# Patient Record
Sex: Female | Born: 1945 | Race: White | Hispanic: No | Marital: Married | Smoking: Never smoker
Health system: Southern US, Community
[De-identification: ages and names within clinical notes are randomized; demographics above are authoritative.]

## PROBLEM LIST (undated history)

## (undated) DIAGNOSIS — J301 Allergic rhinitis due to pollen: Secondary | ICD-10-CM

## (undated) DIAGNOSIS — E7800 Pure hypercholesterolemia, unspecified: Secondary | ICD-10-CM

## (undated) DIAGNOSIS — Z9889 Other specified postprocedural states: Secondary | ICD-10-CM

## (undated) DIAGNOSIS — E2839 Other primary ovarian failure: Secondary | ICD-10-CM

## (undated) DIAGNOSIS — K3580 Unspecified acute appendicitis: Secondary | ICD-10-CM

## (undated) DIAGNOSIS — E559 Vitamin D deficiency, unspecified: Secondary | ICD-10-CM

## (undated) DIAGNOSIS — R112 Nausea with vomiting, unspecified: Secondary | ICD-10-CM

## (undated) DIAGNOSIS — I10 Essential (primary) hypertension: Secondary | ICD-10-CM

## (undated) DIAGNOSIS — K219 Gastro-esophageal reflux disease without esophagitis: Secondary | ICD-10-CM

## (undated) DIAGNOSIS — K200 Eosinophilic esophagitis: Secondary | ICD-10-CM

## (undated) HISTORY — DX: Unspecified acute appendicitis: K35.80

## (undated) HISTORY — DX: Other primary ovarian failure: E28.39

## (undated) HISTORY — DX: Pure hypercholesterolemia, unspecified: E78.00

## (undated) HISTORY — PX: ABDOMINAL HYSTERECTOMY: SHX81

## (undated) HISTORY — DX: Eosinophilic esophagitis: K20.0

## (undated) HISTORY — DX: Allergic rhinitis due to pollen: J30.1

## (undated) HISTORY — DX: Vitamin D deficiency, unspecified: E55.9

## (undated) HISTORY — DX: Gastro-esophageal reflux disease without esophagitis: K21.9

## (undated) HISTORY — DX: Essential (primary) hypertension: I10

---

## 1997-04-15 DIAGNOSIS — C4491 Basal cell carcinoma of skin, unspecified: Secondary | ICD-10-CM

## 1997-04-15 HISTORY — DX: Basal cell carcinoma of skin, unspecified: C44.91

## 1998-02-16 DIAGNOSIS — D229 Melanocytic nevi, unspecified: Secondary | ICD-10-CM

## 1998-02-16 HISTORY — DX: Melanocytic nevi, unspecified: D22.9

## 1998-07-03 ENCOUNTER — Other Ambulatory Visit: Admission: RE | Admit: 1998-07-03 | Discharge: 1998-07-03 | Payer: Self-pay | Admitting: Obstetrics and Gynecology

## 1998-08-29 HISTORY — PX: DILATION AND CURETTAGE OF UTERUS: SHX78

## 1998-09-22 ENCOUNTER — Ambulatory Visit (HOSPITAL_COMMUNITY): Admission: RE | Admit: 1998-09-22 | Discharge: 1998-09-22 | Payer: Self-pay | Admitting: Obstetrics and Gynecology

## 1998-09-22 ENCOUNTER — Encounter (INDEPENDENT_AMBULATORY_CARE_PROVIDER_SITE_OTHER): Payer: Self-pay | Admitting: Specialist

## 2000-09-24 ENCOUNTER — Other Ambulatory Visit: Admission: RE | Admit: 2000-09-24 | Discharge: 2000-09-24 | Payer: Self-pay | Admitting: Obstetrics and Gynecology

## 2000-10-30 ENCOUNTER — Ambulatory Visit (HOSPITAL_COMMUNITY): Admission: RE | Admit: 2000-10-30 | Discharge: 2000-10-30 | Payer: Self-pay | Admitting: *Deleted

## 2000-10-30 ENCOUNTER — Encounter: Payer: Self-pay | Admitting: *Deleted

## 2001-02-10 ENCOUNTER — Inpatient Hospital Stay (HOSPITAL_COMMUNITY): Admission: RE | Admit: 2001-02-10 | Discharge: 2001-02-13 | Payer: Self-pay | Admitting: Obstetrics and Gynecology

## 2001-02-10 ENCOUNTER — Encounter (INDEPENDENT_AMBULATORY_CARE_PROVIDER_SITE_OTHER): Payer: Self-pay | Admitting: Specialist

## 2002-04-09 ENCOUNTER — Other Ambulatory Visit: Admission: RE | Admit: 2002-04-09 | Discharge: 2002-04-09 | Payer: Self-pay | Admitting: Obstetrics and Gynecology

## 2003-10-18 ENCOUNTER — Other Ambulatory Visit: Admission: RE | Admit: 2003-10-18 | Discharge: 2003-10-18 | Payer: Self-pay | Admitting: Obstetrics and Gynecology

## 2008-02-23 ENCOUNTER — Encounter: Payer: Self-pay | Admitting: Pulmonary Disease

## 2008-03-02 ENCOUNTER — Ambulatory Visit: Payer: Self-pay | Admitting: Pulmonary Disease

## 2008-03-02 DIAGNOSIS — E785 Hyperlipidemia, unspecified: Secondary | ICD-10-CM | POA: Insufficient documentation

## 2008-03-02 DIAGNOSIS — D259 Leiomyoma of uterus, unspecified: Secondary | ICD-10-CM | POA: Insufficient documentation

## 2008-03-02 DIAGNOSIS — R05 Cough: Secondary | ICD-10-CM

## 2008-03-02 DIAGNOSIS — J309 Allergic rhinitis, unspecified: Secondary | ICD-10-CM | POA: Insufficient documentation

## 2008-03-02 DIAGNOSIS — D721 Eosinophilia: Secondary | ICD-10-CM

## 2008-03-02 DIAGNOSIS — K219 Gastro-esophageal reflux disease without esophagitis: Secondary | ICD-10-CM | POA: Insufficient documentation

## 2010-02-25 LAB — CONVERTED CEMR LAB
Basophils Absolute: 0.1 10*3/uL (ref 0.0–0.1)
Basophils Relative: 1.5 % (ref 0.0–3.0)
Eosinophils Absolute: 0.7 10*3/uL (ref 0.0–0.7)
Eosinophils Relative: 12.7 % — ABNORMAL HIGH (ref 0.0–5.0)
HCT: 36.3 % (ref 36.0–46.0)
Hemoglobin: 12.5 g/dL (ref 12.0–15.0)
Lymphocytes Relative: 23.4 % (ref 12.0–46.0)
MCHC: 34.4 g/dL (ref 30.0–36.0)
MCV: 84.1 fL (ref 78.0–100.0)
Monocytes Absolute: 0.4 10*3/uL (ref 0.1–1.0)
Monocytes Relative: 7.2 % (ref 3.0–12.0)
Neutro Abs: 3 10*3/uL (ref 1.4–7.7)
Neutrophils Relative %: 55.2 % (ref 43.0–77.0)
Platelets: 215 10*3/uL (ref 150–400)
RBC: 4.31 M/uL (ref 3.87–5.11)
RDW: 12.6 % (ref 11.5–14.6)
WBC: 5.5 10*3/uL (ref 4.5–10.5)

## 2010-03-29 HISTORY — PX: OTHER SURGICAL HISTORY: SHX169

## 2010-04-02 ENCOUNTER — Ambulatory Visit (HOSPITAL_COMMUNITY)
Admission: RE | Admit: 2010-04-02 | Discharge: 2010-04-02 | Disposition: A | Discharge status: Home / Self Care | Payer: Federal, State, Local not specified - PPO | Source: Ambulatory Visit | Attending: Gastroenterology | Admitting: Gastroenterology

## 2010-04-02 DIAGNOSIS — K449 Diaphragmatic hernia without obstruction or gangrene: Secondary | ICD-10-CM | POA: Insufficient documentation

## 2010-04-02 DIAGNOSIS — R1310 Dysphagia, unspecified: Secondary | ICD-10-CM | POA: Insufficient documentation

## 2010-04-02 DIAGNOSIS — K209 Esophagitis, unspecified without bleeding: Secondary | ICD-10-CM | POA: Insufficient documentation

## 2010-04-02 DIAGNOSIS — K222 Esophageal obstruction: Secondary | ICD-10-CM | POA: Insufficient documentation

## 2010-04-18 ENCOUNTER — Ambulatory Visit (HOSPITAL_COMMUNITY)
Admission: RE | Admit: 2010-04-18 | Discharge: 2010-04-18 | Disposition: A | Discharge status: Home / Self Care | Payer: Federal, State, Local not specified - PPO | Source: Ambulatory Visit | Attending: Gastroenterology | Admitting: Gastroenterology

## 2010-04-18 ENCOUNTER — Other Ambulatory Visit: Payer: Self-pay | Admitting: Gastroenterology

## 2010-04-18 DIAGNOSIS — K200 Eosinophilic esophagitis: Secondary | ICD-10-CM | POA: Insufficient documentation

## 2010-04-18 DIAGNOSIS — K449 Diaphragmatic hernia without obstruction or gangrene: Secondary | ICD-10-CM | POA: Insufficient documentation

## 2010-04-18 DIAGNOSIS — R1310 Dysphagia, unspecified: Secondary | ICD-10-CM | POA: Insufficient documentation

## 2010-04-18 DIAGNOSIS — E785 Hyperlipidemia, unspecified: Secondary | ICD-10-CM | POA: Insufficient documentation

## 2010-04-18 DIAGNOSIS — K222 Esophageal obstruction: Secondary | ICD-10-CM | POA: Insufficient documentation

## 2010-04-18 DIAGNOSIS — K219 Gastro-esophageal reflux disease without esophagitis: Secondary | ICD-10-CM | POA: Insufficient documentation

## 2010-06-15 NOTE — Op Note (Signed)
[facility]  Patient:    Sharon Cook, Sharon Cook. Visit Number: [id number] MRN: [id number]          Service Type: Attending:  Festus Holts, M.D. Dictated by:   Festus Holts, M.D.   CC:         Guy Sandifer. Arleta Creek, M.D.   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 Harold Hedge.  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 Harold Hedge.  OPERATION:                    Following Dr. Marvetta Gibbons transabdominal hysterectomy, a conservative lower abdominal dermatolipectomy with a conservative and deep plane liposuction is performed.  SURGEON:                      Festus Holts, M.D.  ANESTHESIA:                   General endotracheal anesthesia plus infiltration of tumescent solution of 500 cc of Ringers lactate out of a 1000 cc bag of Ringers lactate into which 1 cc of 1:1000 epinephrine had been injected.  DESCRIPTION OF PROCEDURE:     Following Dr. Marvetta Gibbons transabdominal hysterectomy, the tumescent solution noted above is injected in the deep plane of the mid upper and lateral abdomen.  Next, using cannulas of 3 and 4 mm, the deep plane of the central abdomen, upper abdomen and very conservatively over the lateral abdomen is performed.  Approximately 900 cc of aspirate is obtained, half of which is liquid tumescent solution.  Next, the transverse suprapubic incision made by Dr. Henderson Cloud is extended laterally to a point proximally 4 cm inferior to the anterior iliac spine. Next, an abdominal flap is elevated from suprapubic area up to the umbilicus and no further.  The flap is elevated along the plane of the external oblique fascias and external fascia of the  anterior rectus sheath.  Next, the anterior rectus sheath is plicated vertically with a running locking 0 Ticron suture. Next, the patient is placed in the semi-Fowlers position and the abdominal flap pulled inferiorly and trimmed and tailored and cut to fit to the transverse lower abdominal incision.  Hemostasis is obtained throughout the procedure by clamps and electrocautery.  The wound cavity is irrigated with copious amount of saline and hemostasis rechecked and found to be adequate. Next, the Scarpa/Campers fascia is closed with interrupted sutures of 3-0 Vicryl and the skin closed with interrupted and running sutures of 3-0 Monocryl subcutaneously.  A 7 mm Jackson-Pratt drain is placed on each side and brought out the mid lateral aspect of the suprapubic area and fixed with 3-0 nylon.  The abdomen is dressed in a light compression dressing.  The patient tolerated the procedure well.  Estimated blood loss 50-75 cc plus that contained within the aspirate which was minimal. Dictated by:   Festus Holts, M.D. Attending:  Festus Holts, M.D. DD:  02/10/01 TD:  02/10/01 Job: [id number]

## 2010-06-15 NOTE — H&P (Signed)
[facility]  Patient:    Sharon Cook, Sharon Cook Visit Number: [id number] MRN: [id number]          Service Type: Attending:  Guy Sandifer. Arleta Creek, M.D. Dictated by:   Guy Sandifer Arleta Creek, M.D. Adm. Date:  02/10/01                           History and Physical  CHIEF COMPLAINT:              Uterine leiomyomata.  HISTORY OF PRESENT ILLNESS:   This patient is a 65 year old married white female, G5, P3, status post tubal ligation with enlarging uterine leiomyomata. The uterus is now approximately 12 plus weeks in size and irregular in shape. This has also been accompanied by right lower quadrant pain on and off. After a careful discussion of the options, the patient is being admitted for a total abdominal hysterectomy and bilateral salpingo-oophorectomy. Dr. Dub Amis will follow with a abdominoplasty and liposuction.  PAST MEDICAL HISTORY:         Negative.  PAST SURGICAL HISTORY:        1. Bilateral tubal ligation.                               2. Hysteroscopy D&C in 2000.  OBSTETRIC HISTORY:            1. Vaginal delivery.                               2. Cesarean section x 2.                               3. Spontaneous abortion x 2.  MEDICATIONS:                  Multivitamins.  ALLERGIES:                    No known drug allergies.  SOCIAL HISTORY:               The patient denies tobacco, alcohol or drug abuse.  FAMILY HISTORY:               Positive for hypertension in mother, heart disease in father and sister, hypothyroidism in mother.  REVIEW OF SYSTEMS:            Negative except as above.  PHYSICAL EXAMINATION:  VITAL SIGNS:                  Height 5 feet 2 inches, weight 159 1/2 pounds. Blood pressure 130/80.  HEENT:                        Without thyromegaly.  LUNGS:                        Clear to auscultation.  HEART:                        Regular rate and rhythm.  BACK:                         Without CVA  tenderness.  BREASTS:  Without mass, traction or discharge.  ABDOMEN:                      Soft, nontender without masses.  PELVIC:                       Vulva, vagina, and cervix without lesions. Uterus is regular in contour with at least one fibroid on the uterine fundus measuring at least 4 cm with the uterus measuring 10-12 cm overall in size. Adnexal exam nontender without masses.  RECTAL:                       Without masses.  EXTREMITIES:                  Grossly within normal limits.  NEUROLOGIC:                   Grossly within normal limits.  ASSESSMENT:                   Enlarging uterine leiomyomata.  PLAN:                         Total abdominal hysterectomy and bilateral salpingo-oophorectomy. Dictated by:   Guy Sandifer Arleta Creek, M.D. Attending:  Guy Sandifer Arleta Creek, M.D. DD:  02/09/01 TD:  02/09/01 Job: [id number]

## 2010-06-15 NOTE — Op Note (Signed)
[facility]  Patient:    Sharon Cook, Sharon Cook Visit Number: [id number] MRN: [id number]          Service Type: GYN Location: [id number] Attending Physician:  Soledad Gerlach Dictated by:   Guy Sandifer Arleta Creek, M.D. Proc. Date: 02/10/01 Admit Date:  02/10/2001                             Operative Report  PREOPERATIVE DIAGNOSIS:       Uterine leiomyomata.  POSTOPERATIVE DIAGNOSIS:      Uterine leiomyomata.  OPERATION:                    Total abdominal hysterectomy with bilateral                               salpingo-oophorectomy.  SURGEON:                      Guy Sandifer. Arleta Creek, M.D.  ASSISTANT:                    Duke Salvia. Marcelle Overlie, M.D.  ANESTHESIA:                   General with endotracheal intubation - Gretta Cool., M.D.  ESTIMATED BLOOD LOSS:         100 cc.  INDICATIONS AND CONSENT:      The patient is a 65 year old married white female, G5, P3, status post tubal ligation with enlarging uterine leiomyomata. Details are dictated in History and Physical.  Total abdominal hysterectomy with bilateral salpingo-oophorectomy is discussed with the patient.  The patient will have abdominoplasty and liposuction with Dr. Dub Amis to follow the hysterectomy.  Potential risks and complications have been discussed including, but not limited to infection, bowel, bladder or ureteral damage, bleeding requiring transfusion of blood products with possible transfusion reaction, HIV and hepatitis acquisition, DVT, PE, and pneumonia, fistula formation, postoperative dyspareunia.  All questions have been answered and consent is signed on the chart.  FINDINGS:                     The uterus is approximately 10 weeks in size. The proximal segment of the fallopian tube is adherent to the anterior uterine fundus from the left side.  The right proximal tube is also adherent with multiple filmy adhesions.   Ovaries are normal bilaterally.  Anterior cul-de-sac was also scarred down with multiple adhesions of the bladder to the lower anterior uterine fundus.  DESCRIPTION OF PROCEDURE:     The patient was taken to the operating room where she was placed in the dorsolithotomy position.  She had general anesthesia via endotracheal intubation.  She then had an extensive abdominal prep per Dr. Dub Amis as well as a vaginal prep.  A Foley catheter was placed. She was draped in a sterile fashion.  Then, entering through a Pfannenstiel incision, dissection was carried out in layers to the peritoneum.  The peritoneum was incised and extended superiorly and inferiorly.  A OSullivan-OConnor retractor was placed and towels were placed under the retractor bilaterally to help prevent any undue compression of  the pelvis with the retractor.  The bladder blade was placed.  The bowel was packed away and the upper blade was placed.  The adhesions on the anterior fundus are taken down sharply and bluntly.  Kelly clamps were placed in the proximal ligaments bilaterally.  The left round ligament is identified and ligated with 0 Monocryl suture.  It was then taken down sharply.  The anterior mesosalpinx was taken down.  The posterior leaf of the broad ligament is bluntly perforated and the LigaSure instrument was used to take down the infundibulopelvic ligament.  This was after careful identification of the ureter, and revealed it to be well-away from the area of surgery.  A similar procedure was then carried out on the right side.  After carefully advancing the bladder adequately, the right uterine vessels are skeletonized, and then taken down with the LigaSure.  A similar procedure was carried out on the left side.  Then, using the straight Heaney clamps, progressive bites were used to take down the cardinal ligaments bilaterally.  Curved clamps were then used to take down the uterosacral ligaments bilaterally,  and the vaginal fornix was entered bilaterally.  The specimen was then cut free with Satinsky scissors.  Careful inspection reveals the cervix to have been totally removed. The vaginal cuff was then closed with figure-of-eight sutures.  Uterosacral ligaments were plicated in the midline with a single suture.  Hemostasis is obtained with unipolar cautery.  The distal portion of the right fallopian tube was adherent to the right pelvic side wall, and this then sharply resected.  The appendix is normal.  Again, copious irrigation is carried out and all returns is clear.  Packs are removed.  Retractors are removed and the anterior peritoneum is closed in a running fashion with 2-0 Monocryl suture which is also used to reapproximate the pyramidalis muscle in the midline.  The anterior rectus fascia was then closed in a running fashion with 0 PDS suture.  At this point, Dr. Dub Amis enters the room, and performs his procedure under separate dictation. Dictated by:   Guy Sandifer Arleta Creek, M.D. Attending Physician:  Soledad Gerlach DD:  02/10/01 TD:  02/10/01 Job: [id number]

## 2010-06-15 NOTE — Discharge Summary (Signed)
[facility]  Patient:    Sharon Cook, Sharon Cook Visit Number: [id number] MRN: [id number]          Service Type: GYN Location: [phone] [id number] Attending Physician:  Soledad Gerlach Dictated by:   Guy Sandifer Arleta Creek, M.D. Admit Date:  02/10/2001 Discharge Date: 02/13/2001   CC:         Festus Holts, M.D.   Discharge Summary  ADMISSION DIAGNOSES:          1. Uterine leiomyomata.                               2. Abdominal lipodystrophy.  DISCHARGE DIAGNOSES:          1. Uterine leiomyomata.                               2. Abdominal lipodystrophy.  PROCEDURE February 10, 2001:   1. Total abdominal hysterectomy with bilateral                                  salpingo-oophorectomy.                               2. Lower abdominal dermatolipectomy per                                  Dr. Dub Amis.  REASON FOR ADMISSION:         This patient is a 65 year old married, white female G5, P3 status post tubal ligation with enlarging uterine leiomyomata. Details are dictated in history and physical.  She is being admitted for the above procedures.  HOSPITAL COURSE:              The patient is taken to the operating room and undergoes the above procedures without complication.  Estimated blood loss is a total of approximately 175 cc.  On the evening of surgery, she has adequate pain control and is resting.  She remains afebrile with stable vital signs. On the first postoperative day, hemoglobin is 9.0 and white count is 6.5.  She has some nausea and is unable to tolerate broth.  No flatus.  She remains stable with afebrile temperature.  Her urine output is good and her drains are okay per Dr. Dub Amis exam.  On the second postoperative day, still has not passed flatus, has some mild nausea, but is ambulating.  Maximum temperature is 100.7, although she is 98.8 at the time of examination.  Lungs are clear. Extremities are nontender, without cords.   Abdominal exam is okay per Dr. Dub Amis examination.  On the day of discharge, she is feeling better, having small bowel movements and ambulating without trouble.  She remains afebrile.  CONDITION ON DISCHARGE:       Good.  DIET:                         Regular as tolerated.  ACTIVITY:                     1. No lifting.  2. No operation of automobiles.                               3. No vaginal entry.  MEDICATIONS:                  1. Percocet 5/325 #30, 1-2 p.o. q.4h. p.r.n.                               2. Multivitamin daily.                               3. Iron supplement daily.  DISCHARGE INSTRUCTIONS:       She is to call the office for problems, including, but not limited to, temperature of greater than 100.5, increasing pain, persistent nausea, vomiting or heavy bleeding.  FOLLOW-UP:                    Follow up is in my office in two weeks and in Dr. Dub Amis office as instructed. Dictated by:   Guy Sandifer Arleta Creek, M.D. Attending Physician:  Soledad Gerlach DD:  02/13/01 TD:  02/15/01 Job: [id number]

## 2011-01-29 DIAGNOSIS — I10 Essential (primary) hypertension: Secondary | ICD-10-CM

## 2011-01-29 HISTORY — DX: Essential (primary) hypertension: I10

## 2012-03-30 DIAGNOSIS — E7800 Pure hypercholesterolemia, unspecified: Secondary | ICD-10-CM | POA: Diagnosis not present

## 2012-03-30 DIAGNOSIS — I10 Essential (primary) hypertension: Secondary | ICD-10-CM | POA: Diagnosis not present

## 2012-05-01 DIAGNOSIS — J069 Acute upper respiratory infection, unspecified: Secondary | ICD-10-CM | POA: Diagnosis not present

## 2012-05-01 DIAGNOSIS — Z1331 Encounter for screening for depression: Secondary | ICD-10-CM | POA: Diagnosis not present

## 2012-05-26 DIAGNOSIS — H04129 Dry eye syndrome of unspecified lacrimal gland: Secondary | ICD-10-CM | POA: Diagnosis not present

## 2012-06-09 DIAGNOSIS — Z1231 Encounter for screening mammogram for malignant neoplasm of breast: Secondary | ICD-10-CM | POA: Diagnosis not present

## 2012-06-09 DIAGNOSIS — R928 Other abnormal and inconclusive findings on diagnostic imaging of breast: Secondary | ICD-10-CM | POA: Diagnosis not present

## 2012-06-16 DIAGNOSIS — Z1231 Encounter for screening mammogram for malignant neoplasm of breast: Secondary | ICD-10-CM | POA: Diagnosis not present

## 2012-06-16 DIAGNOSIS — R928 Other abnormal and inconclusive findings on diagnostic imaging of breast: Secondary | ICD-10-CM | POA: Diagnosis not present

## 2012-08-24 DIAGNOSIS — I10 Essential (primary) hypertension: Secondary | ICD-10-CM | POA: Diagnosis not present

## 2013-01-12 DIAGNOSIS — L821 Other seborrheic keratosis: Secondary | ICD-10-CM | POA: Diagnosis not present

## 2013-01-12 DIAGNOSIS — D239 Other benign neoplasm of skin, unspecified: Secondary | ICD-10-CM | POA: Diagnosis not present

## 2013-01-12 DIAGNOSIS — L570 Actinic keratosis: Secondary | ICD-10-CM | POA: Diagnosis not present

## 2013-02-25 DIAGNOSIS — J3489 Other specified disorders of nose and nasal sinuses: Secondary | ICD-10-CM | POA: Diagnosis not present

## 2013-06-15 DIAGNOSIS — H269 Unspecified cataract: Secondary | ICD-10-CM | POA: Diagnosis not present

## 2013-06-16 DIAGNOSIS — Z1231 Encounter for screening mammogram for malignant neoplasm of breast: Secondary | ICD-10-CM | POA: Diagnosis not present

## 2013-06-23 DIAGNOSIS — R928 Other abnormal and inconclusive findings on diagnostic imaging of breast: Secondary | ICD-10-CM | POA: Diagnosis not present

## 2013-06-23 DIAGNOSIS — R922 Inconclusive mammogram: Secondary | ICD-10-CM | POA: Diagnosis not present

## 2013-06-23 DIAGNOSIS — N6459 Other signs and symptoms in breast: Secondary | ICD-10-CM | POA: Diagnosis not present

## 2013-07-08 DIAGNOSIS — I10 Essential (primary) hypertension: Secondary | ICD-10-CM | POA: Diagnosis not present

## 2013-07-29 DIAGNOSIS — I10 Essential (primary) hypertension: Secondary | ICD-10-CM | POA: Diagnosis not present

## 2013-10-25 DIAGNOSIS — H6120 Impacted cerumen, unspecified ear: Secondary | ICD-10-CM | POA: Diagnosis not present

## 2013-10-25 DIAGNOSIS — H6980 Other specified disorders of Eustachian tube, unspecified ear: Secondary | ICD-10-CM | POA: Diagnosis not present

## 2013-12-20 DIAGNOSIS — L659 Nonscarring hair loss, unspecified: Secondary | ICD-10-CM | POA: Diagnosis not present

## 2013-12-20 DIAGNOSIS — Z23 Encounter for immunization: Secondary | ICD-10-CM | POA: Diagnosis not present

## 2013-12-20 DIAGNOSIS — K219 Gastro-esophageal reflux disease without esophagitis: Secondary | ICD-10-CM | POA: Diagnosis not present

## 2013-12-20 DIAGNOSIS — K200 Eosinophilic esophagitis: Secondary | ICD-10-CM | POA: Diagnosis not present

## 2013-12-20 DIAGNOSIS — E780 Pure hypercholesterolemia: Secondary | ICD-10-CM | POA: Diagnosis not present

## 2013-12-20 DIAGNOSIS — N951 Menopausal and female climacteric states: Secondary | ICD-10-CM | POA: Diagnosis not present

## 2013-12-20 DIAGNOSIS — I10 Essential (primary) hypertension: Secondary | ICD-10-CM | POA: Diagnosis not present

## 2013-12-20 DIAGNOSIS — J301 Allergic rhinitis due to pollen: Secondary | ICD-10-CM | POA: Diagnosis not present

## 2013-12-20 DIAGNOSIS — Z0000 Encounter for general adult medical examination without abnormal findings: Secondary | ICD-10-CM | POA: Diagnosis not present

## 2014-01-04 DIAGNOSIS — Z09 Encounter for follow-up examination after completed treatment for conditions other than malignant neoplasm: Secondary | ICD-10-CM | POA: Diagnosis not present

## 2014-01-04 DIAGNOSIS — R928 Other abnormal and inconclusive findings on diagnostic imaging of breast: Secondary | ICD-10-CM | POA: Diagnosis not present

## 2014-01-04 DIAGNOSIS — Z780 Asymptomatic menopausal state: Secondary | ICD-10-CM | POA: Diagnosis not present

## 2014-01-04 DIAGNOSIS — N6002 Solitary cyst of left breast: Secondary | ICD-10-CM | POA: Diagnosis not present

## 2014-05-04 DIAGNOSIS — E780 Pure hypercholesterolemia: Secondary | ICD-10-CM | POA: Diagnosis not present

## 2014-05-04 DIAGNOSIS — Z79899 Other long term (current) drug therapy: Secondary | ICD-10-CM | POA: Diagnosis not present

## 2014-06-01 DIAGNOSIS — R05 Cough: Secondary | ICD-10-CM | POA: Diagnosis not present

## 2014-06-14 DIAGNOSIS — H524 Presbyopia: Secondary | ICD-10-CM | POA: Diagnosis not present

## 2014-06-14 DIAGNOSIS — H04123 Dry eye syndrome of bilateral lacrimal glands: Secondary | ICD-10-CM | POA: Diagnosis not present

## 2014-06-14 DIAGNOSIS — H1045 Other chronic allergic conjunctivitis: Secondary | ICD-10-CM | POA: Diagnosis not present

## 2014-06-14 DIAGNOSIS — H5203 Hypermetropia, bilateral: Secondary | ICD-10-CM | POA: Diagnosis not present

## 2014-08-24 DIAGNOSIS — N6002 Solitary cyst of left breast: Secondary | ICD-10-CM | POA: Diagnosis not present

## 2014-08-24 DIAGNOSIS — N63 Unspecified lump in breast: Secondary | ICD-10-CM | POA: Diagnosis not present

## 2014-10-06 DIAGNOSIS — E780 Pure hypercholesterolemia: Secondary | ICD-10-CM | POA: Diagnosis not present

## 2014-10-06 DIAGNOSIS — Z23 Encounter for immunization: Secondary | ICD-10-CM | POA: Diagnosis not present

## 2014-10-06 DIAGNOSIS — I10 Essential (primary) hypertension: Secondary | ICD-10-CM | POA: Diagnosis not present

## 2014-10-06 DIAGNOSIS — Z1389 Encounter for screening for other disorder: Secondary | ICD-10-CM | POA: Diagnosis not present

## 2015-02-22 DIAGNOSIS — N6002 Solitary cyst of left breast: Secondary | ICD-10-CM | POA: Diagnosis not present

## 2015-05-31 DIAGNOSIS — J069 Acute upper respiratory infection, unspecified: Secondary | ICD-10-CM | POA: Diagnosis not present

## 2015-06-01 DIAGNOSIS — D509 Iron deficiency anemia, unspecified: Secondary | ICD-10-CM | POA: Diagnosis not present

## 2015-08-16 DIAGNOSIS — Z0000 Encounter for general adult medical examination without abnormal findings: Secondary | ICD-10-CM | POA: Diagnosis not present

## 2015-08-16 DIAGNOSIS — I10 Essential (primary) hypertension: Secondary | ICD-10-CM | POA: Diagnosis not present

## 2015-08-16 DIAGNOSIS — E7800 Pure hypercholesterolemia, unspecified: Secondary | ICD-10-CM | POA: Diagnosis not present

## 2015-09-11 DIAGNOSIS — H524 Presbyopia: Secondary | ICD-10-CM | POA: Diagnosis not present

## 2015-09-11 DIAGNOSIS — H52222 Regular astigmatism, left eye: Secondary | ICD-10-CM | POA: Diagnosis not present

## 2015-09-11 DIAGNOSIS — G44209 Tension-type headache, unspecified, not intractable: Secondary | ICD-10-CM | POA: Diagnosis not present

## 2015-09-11 DIAGNOSIS — H5203 Hypermetropia, bilateral: Secondary | ICD-10-CM | POA: Diagnosis not present

## 2015-10-16 DIAGNOSIS — Z1231 Encounter for screening mammogram for malignant neoplasm of breast: Secondary | ICD-10-CM | POA: Diagnosis not present

## 2016-03-11 DIAGNOSIS — J101 Influenza due to other identified influenza virus with other respiratory manifestations: Secondary | ICD-10-CM | POA: Diagnosis not present

## 2016-03-11 DIAGNOSIS — R6889 Other general symptoms and signs: Secondary | ICD-10-CM | POA: Diagnosis not present

## 2016-10-15 DIAGNOSIS — E559 Vitamin D deficiency, unspecified: Secondary | ICD-10-CM | POA: Diagnosis not present

## 2016-10-15 DIAGNOSIS — Z1231 Encounter for screening mammogram for malignant neoplasm of breast: Secondary | ICD-10-CM | POA: Diagnosis not present

## 2016-10-15 DIAGNOSIS — E2839 Other primary ovarian failure: Secondary | ICD-10-CM | POA: Diagnosis not present

## 2016-10-15 DIAGNOSIS — Z23 Encounter for immunization: Secondary | ICD-10-CM | POA: Diagnosis not present

## 2016-10-15 DIAGNOSIS — E7800 Pure hypercholesterolemia, unspecified: Secondary | ICD-10-CM | POA: Diagnosis not present

## 2016-10-15 DIAGNOSIS — I10 Essential (primary) hypertension: Secondary | ICD-10-CM | POA: Diagnosis not present

## 2016-10-15 DIAGNOSIS — Z0000 Encounter for general adult medical examination without abnormal findings: Secondary | ICD-10-CM | POA: Diagnosis not present

## 2016-10-15 DIAGNOSIS — K200 Eosinophilic esophagitis: Secondary | ICD-10-CM | POA: Diagnosis not present

## 2016-10-15 DIAGNOSIS — K219 Gastro-esophageal reflux disease without esophagitis: Secondary | ICD-10-CM | POA: Diagnosis not present

## 2017-02-05 DIAGNOSIS — Z1231 Encounter for screening mammogram for malignant neoplasm of breast: Secondary | ICD-10-CM | POA: Diagnosis not present

## 2017-02-05 DIAGNOSIS — M85852 Other specified disorders of bone density and structure, left thigh: Secondary | ICD-10-CM | POA: Diagnosis not present

## 2017-02-13 ENCOUNTER — Inpatient Hospital Stay (HOSPITAL_BASED_OUTPATIENT_CLINIC_OR_DEPARTMENT_OTHER)
Admission: EM | Admit: 2017-02-13 | Discharge: 2017-02-15 | DRG: 343 | Disposition: A | Discharge status: Home / Self Care | Payer: Medicare Other | Attending: General Surgery | Admitting: General Surgery

## 2017-02-13 ENCOUNTER — Other Ambulatory Visit: Payer: Self-pay

## 2017-02-13 ENCOUNTER — Encounter (HOSPITAL_BASED_OUTPATIENT_CLINIC_OR_DEPARTMENT_OTHER): Payer: Self-pay

## 2017-02-13 DIAGNOSIS — Z885 Allergy status to narcotic agent status: Secondary | ICD-10-CM | POA: Diagnosis not present

## 2017-02-13 DIAGNOSIS — K660 Peritoneal adhesions (postprocedural) (postinfection): Secondary | ICD-10-CM | POA: Diagnosis not present

## 2017-02-13 DIAGNOSIS — Z90710 Acquired absence of both cervix and uterus: Secondary | ICD-10-CM | POA: Diagnosis not present

## 2017-02-13 DIAGNOSIS — N200 Calculus of kidney: Secondary | ICD-10-CM | POA: Diagnosis not present

## 2017-02-13 DIAGNOSIS — K3580 Unspecified acute appendicitis: Principal | ICD-10-CM | POA: Diagnosis present

## 2017-02-13 DIAGNOSIS — R112 Nausea with vomiting, unspecified: Secondary | ICD-10-CM | POA: Diagnosis not present

## 2017-02-13 DIAGNOSIS — K5730 Diverticulosis of large intestine without perforation or abscess without bleeding: Secondary | ICD-10-CM | POA: Diagnosis not present

## 2017-02-13 HISTORY — DX: Other specified postprocedural states: R11.2

## 2017-02-13 HISTORY — DX: Other specified postprocedural states: Z98.890

## 2017-02-13 LAB — URINALYSIS, ROUTINE W REFLEX MICROSCOPIC
Bilirubin Urine: NEGATIVE
Glucose, UA: NEGATIVE mg/dL
KETONES UR: NEGATIVE mg/dL
LEUKOCYTES UA: NEGATIVE
NITRITE: NEGATIVE
PH: 7 (ref 5.0–8.0)
Protein, ur: NEGATIVE mg/dL
SPECIFIC GRAVITY, URINE: 1.015 (ref 1.005–1.030)

## 2017-02-13 LAB — URINALYSIS, MICROSCOPIC (REFLEX): WBC UA: NONE SEEN WBC/hpf (ref 0–5)

## 2017-02-13 MED ORDER — ONDANSETRON HCL 4 MG/2ML IJ SOLN
4.0000 mg | Freq: Once | INTRAMUSCULAR | Status: AC | PRN
  Administered 2017-02-13: 4 mg via INTRAVENOUS
  Filled 2017-02-13: qty 2

## 2017-02-13 NOTE — ED Triage Notes (Signed)
Bilateral lower abdominal pain that started this morning with 4 episodes of vomiting, no diarrhea, no fevers, no meds at home prior to arrival

## 2017-02-14 ENCOUNTER — Encounter (HOSPITAL_BASED_OUTPATIENT_CLINIC_OR_DEPARTMENT_OTHER): Payer: Self-pay | Admitting: Emergency Medicine

## 2017-02-14 ENCOUNTER — Inpatient Hospital Stay (HOSPITAL_COMMUNITY): Payer: Medicare Other | Admitting: Anesthesiology

## 2017-02-14 ENCOUNTER — Emergency Department (HOSPITAL_BASED_OUTPATIENT_CLINIC_OR_DEPARTMENT_OTHER): Payer: Medicare Other

## 2017-02-14 ENCOUNTER — Encounter (HOSPITAL_COMMUNITY): Admission: EM | Disposition: A | Discharge status: Home / Self Care | Payer: Self-pay | Source: Home / Self Care

## 2017-02-14 DIAGNOSIS — N200 Calculus of kidney: Secondary | ICD-10-CM | POA: Diagnosis not present

## 2017-02-14 DIAGNOSIS — K660 Peritoneal adhesions (postprocedural) (postinfection): Secondary | ICD-10-CM | POA: Diagnosis present

## 2017-02-14 DIAGNOSIS — Z90710 Acquired absence of both cervix and uterus: Secondary | ICD-10-CM | POA: Diagnosis not present

## 2017-02-14 DIAGNOSIS — Z885 Allergy status to narcotic agent status: Secondary | ICD-10-CM | POA: Diagnosis not present

## 2017-02-14 DIAGNOSIS — K5730 Diverticulosis of large intestine without perforation or abscess without bleeding: Secondary | ICD-10-CM | POA: Diagnosis not present

## 2017-02-14 DIAGNOSIS — J309 Allergic rhinitis, unspecified: Secondary | ICD-10-CM | POA: Diagnosis not present

## 2017-02-14 DIAGNOSIS — D259 Leiomyoma of uterus, unspecified: Secondary | ICD-10-CM | POA: Diagnosis not present

## 2017-02-14 DIAGNOSIS — K3580 Unspecified acute appendicitis: Secondary | ICD-10-CM | POA: Diagnosis present

## 2017-02-14 DIAGNOSIS — I10 Essential (primary) hypertension: Secondary | ICD-10-CM | POA: Diagnosis not present

## 2017-02-14 HISTORY — DX: Unspecified acute appendicitis: K35.80

## 2017-02-14 HISTORY — PX: LAPAROSCOPIC APPENDECTOMY: SHX408

## 2017-02-14 LAB — COMPREHENSIVE METABOLIC PANEL
ALBUMIN: 4.4 g/dL (ref 3.5–5.0)
ALT: 22 U/L (ref 14–54)
ANION GAP: 10 (ref 5–15)
AST: 26 U/L (ref 15–41)
Alkaline Phosphatase: 51 U/L (ref 38–126)
BUN: 25 mg/dL — ABNORMAL HIGH (ref 6–20)
CHLORIDE: 99 mmol/L — AB (ref 101–111)
CO2: 28 mmol/L (ref 22–32)
Calcium: 9.4 mg/dL (ref 8.9–10.3)
Creatinine, Ser: 0.9 mg/dL (ref 0.44–1.00)
GFR calc Af Amer: >60 mL/min (ref >60)
GFR calc non Af Amer: >60 mL/min (ref >60)
GLUCOSE: 150 mg/dL — AB (ref 65–99)
POTASSIUM: 3.5 mmol/L (ref 3.5–5.1)
SODIUM: 137 mmol/L (ref 135–145)
Total Bilirubin: 0.5 mg/dL (ref 0.3–1.2)
Total Protein: 8.2 g/dL — ABNORMAL HIGH (ref 6.5–8.1)

## 2017-02-14 LAB — CBC
HCT: 34 % — ABNORMAL LOW (ref 36.0–46.0)
HCT: 38.8 % (ref 36.0–46.0)
HEMOGLOBIN: 12.9 g/dL (ref 12.0–15.0)
Hemoglobin: 11 g/dL — ABNORMAL LOW (ref 12.0–15.0)
MCH: 27.5 pg (ref 26.0–34.0)
MCH: 27.9 pg (ref 26.0–34.0)
MCHC: 32.4 g/dL (ref 30.0–36.0)
MCHC: 33.2 g/dL (ref 30.0–36.0)
MCV: 84 fL (ref 78.0–100.0)
MCV: 85 fL (ref 78.0–100.0)
PLATELETS: 237 10*3/uL (ref 150–400)
Platelets: 192 10*3/uL (ref 150–400)
RBC: 4 MIL/uL (ref 3.87–5.11)
RBC: 4.62 MIL/uL (ref 3.87–5.11)
RDW: 13.6 % (ref 11.5–15.5)
RDW: 13.7 % (ref 11.5–15.5)
WBC: 10.7 10*3/uL — AB (ref 4.0–10.5)
WBC: 13.7 10*3/uL — AB (ref 4.0–10.5)

## 2017-02-14 LAB — BASIC METABOLIC PANEL
ANION GAP: 11 (ref 5–15)
BUN: 14 mg/dL (ref 6–20)
CALCIUM: 8.4 mg/dL — AB (ref 8.9–10.3)
CO2: 24 mmol/L (ref 22–32)
Chloride: 105 mmol/L (ref 101–111)
Creatinine, Ser: 0.78 mg/dL (ref 0.44–1.00)
GFR calc Af Amer: >60 mL/min (ref >60)
GFR calc non Af Amer: >60 mL/min (ref >60)
GLUCOSE: 132 mg/dL — AB (ref 65–99)
Potassium: 3.4 mmol/L — ABNORMAL LOW (ref 3.5–5.1)
SODIUM: 140 mmol/L (ref 135–145)

## 2017-02-14 LAB — LIPASE, BLOOD: LIPASE: 31 U/L (ref 11–51)

## 2017-02-14 SURGERY — APPENDECTOMY, LAPAROSCOPIC
Anesthesia: General

## 2017-02-14 MED ORDER — METRONIDAZOLE IN NACL 5-0.79 MG/ML-% IV SOLN
500.0000 mg | Freq: Three times a day (TID) | INTRAVENOUS | Status: DC
  Administered 2017-02-14 – 2017-02-15 (×3): 500 mg via INTRAVENOUS
  Filled 2017-02-14 (×3): qty 100

## 2017-02-14 MED ORDER — SODIUM CHLORIDE 0.9 % IV SOLN
INTRAVENOUS | Status: DC
  Administered 2017-02-14 – 2017-02-15 (×2): via INTRAVENOUS

## 2017-02-14 MED ORDER — METRONIDAZOLE IN NACL 5-0.79 MG/ML-% IV SOLN
500.0000 mg | Freq: Once | INTRAVENOUS | Status: AC
  Administered 2017-02-14: 500 mg via INTRAVENOUS
  Filled 2017-02-14: qty 100

## 2017-02-14 MED ORDER — MORPHINE SULFATE (PF) 4 MG/ML IV SOLN: 2.0000 mg | INTRAVENOUS | Status: DC | PRN

## 2017-02-14 MED ORDER — SODIUM CHLORIDE 0.9 % IR SOLN
Status: DC | PRN
  Administered 2017-02-14: 1000 mL

## 2017-02-14 MED ORDER — SCOPOLAMINE 1 MG/3DAYS TD PT72: 1.0000 | MEDICATED_PATCH | Freq: Once | TRANSDERMAL | Status: DC

## 2017-02-14 MED ORDER — LACTATED RINGERS IV SOLN
INTRAVENOUS | Status: DC
  Administered 2017-02-14 (×2): via INTRAVENOUS

## 2017-02-14 MED ORDER — PHENYLEPHRINE HCL 10 MG/ML IJ SOLN
INTRAMUSCULAR | Status: DC | PRN
  Administered 2017-02-14: 80 ug via INTRAVENOUS
  Administered 2017-02-14: 120 ug via INTRAVENOUS

## 2017-02-14 MED ORDER — MIDAZOLAM HCL 2 MG/2ML IJ SOLN
INTRAMUSCULAR | Status: AC
  Filled 2017-02-14: qty 2

## 2017-02-14 MED ORDER — MIDAZOLAM HCL 5 MG/5ML IJ SOLN
INTRAMUSCULAR | Status: DC | PRN
  Administered 2017-02-14: 1 mg via INTRAVENOUS

## 2017-02-14 MED ORDER — DEXTROSE 5 % IV SOLN
2.0000 g | Freq: Once | INTRAVENOUS | Status: AC
  Administered 2017-02-14: 2 g via INTRAVENOUS
  Filled 2017-02-14: qty 2

## 2017-02-14 MED ORDER — ONDANSETRON HCL 4 MG/2ML IJ SOLN: 4.0000 mg | Freq: Four times a day (QID) | INTRAMUSCULAR | Status: DC | PRN

## 2017-02-14 MED ORDER — PROPOFOL 10 MG/ML IV BOLUS
INTRAVENOUS | Status: AC
  Filled 2017-02-14: qty 20

## 2017-02-14 MED ORDER — BUPIVACAINE-EPINEPHRINE 0.5% -1:200000 IJ SOLN
INTRAMUSCULAR | Status: DC | PRN
  Administered 2017-02-14: 20 mL

## 2017-02-14 MED ORDER — ENOXAPARIN SODIUM 40 MG/0.4ML ~~LOC~~ SOLN
40.0000 mg | SUBCUTANEOUS | Status: DC
  Administered 2017-02-14: 40 mg via SUBCUTANEOUS
  Filled 2017-02-14: qty 0.4

## 2017-02-14 MED ORDER — HYDRALAZINE HCL 20 MG/ML IJ SOLN: 10.0000 mg | INTRAMUSCULAR | Status: DC | PRN

## 2017-02-14 MED ORDER — PROPOFOL 10 MG/ML IV BOLUS
INTRAVENOUS | Status: DC | PRN
  Administered 2017-02-14: 110 mg via INTRAVENOUS

## 2017-02-14 MED ORDER — ACETAMINOPHEN 325 MG PO TABS
650.0000 mg | ORAL_TABLET | Freq: Four times a day (QID) | ORAL | Status: DC
  Administered 2017-02-14 – 2017-02-15 (×3): 650 mg via ORAL
  Filled 2017-02-14 (×3): qty 2

## 2017-02-14 MED ORDER — SODIUM CHLORIDE 0.9 % IV SOLN: INTRAVENOUS | Status: DC

## 2017-02-14 MED ORDER — PROMETHAZINE HCL 25 MG/ML IJ SOLN: 6.2500 mg | INTRAMUSCULAR | Status: DC | PRN

## 2017-02-14 MED ORDER — IOPAMIDOL (ISOVUE-300) INJECTION 61%
100.0000 mL | Freq: Once | INTRAVENOUS | Status: AC | PRN
  Administered 2017-02-14: 100 mL via INTRAVENOUS

## 2017-02-14 MED ORDER — FENTANYL CITRATE (PF) 250 MCG/5ML IJ SOLN
INTRAMUSCULAR | Status: DC | PRN
  Administered 2017-02-14 (×2): 50 ug via INTRAVENOUS

## 2017-02-14 MED ORDER — MEPERIDINE HCL 25 MG/ML IJ SOLN: 6.2500 mg | INTRAMUSCULAR | Status: DC | PRN

## 2017-02-14 MED ORDER — ONDANSETRON HCL 4 MG/2ML IJ SOLN
INTRAMUSCULAR | Status: DC | PRN
  Administered 2017-02-14: 4 mg via INTRAVENOUS

## 2017-02-14 MED ORDER — ACETAMINOPHEN 325 MG PO TABS
650.0000 mg | ORAL_TABLET | Freq: Four times a day (QID) | ORAL | Status: DC | PRN
Start: 2017-02-14 — End: 2017-02-14
  Administered 2017-02-14: 650 mg via ORAL

## 2017-02-14 MED ORDER — DEXAMETHASONE SODIUM PHOSPHATE 10 MG/ML IJ SOLN
INTRAMUSCULAR | Status: DC | PRN
  Administered 2017-02-14: 10 mg via INTRAVENOUS

## 2017-02-14 MED ORDER — METRONIDAZOLE IN NACL 5-0.79 MG/ML-% IV SOLN
500.0000 mg | Freq: Three times a day (TID) | INTRAVENOUS | Status: DC
  Administered 2017-02-14: 500 mg via INTRAVENOUS
  Filled 2017-02-14: qty 100

## 2017-02-14 MED ORDER — 0.9 % SODIUM CHLORIDE (POUR BTL) OPTIME
TOPICAL | Status: DC | PRN
  Administered 2017-02-14: 1000 mL

## 2017-02-14 MED ORDER — FENTANYL CITRATE (PF) 100 MCG/2ML IJ SOLN
50.0000 ug | INTRAMUSCULAR | Status: DC | PRN
  Administered 2017-02-14 (×2): 50 ug via INTRAVENOUS
  Filled 2017-02-14 (×2): qty 2

## 2017-02-14 MED ORDER — GABAPENTIN 100 MG PO CAPS
200.0000 mg | ORAL_CAPSULE | Freq: Two times a day (BID) | ORAL | Status: DC
  Administered 2017-02-14 – 2017-02-15 (×3): 200 mg via ORAL
  Filled 2017-02-14 (×3): qty 2

## 2017-02-14 MED ORDER — FENTANYL CITRATE (PF) 100 MCG/2ML IJ SOLN
100.0000 ug | Freq: Once | INTRAMUSCULAR | Status: AC
  Administered 2017-02-14: 100 ug via INTRAVENOUS
  Filled 2017-02-14: qty 2

## 2017-02-14 MED ORDER — DIPHENHYDRAMINE HCL 50 MG/ML IJ SOLN: 12.5000 mg | Freq: Four times a day (QID) | INTRAMUSCULAR | Status: DC | PRN

## 2017-02-14 MED ORDER — ONDANSETRON 4 MG PO TBDP: 4.0000 mg | ORAL_TABLET | Freq: Four times a day (QID) | ORAL | Status: DC | PRN

## 2017-02-14 MED ORDER — OXYCODONE HCL 5 MG PO TABS
5.0000 mg | ORAL_TABLET | ORAL | Status: DC | PRN
  Administered 2017-02-14: 10 mg via ORAL

## 2017-02-14 MED ORDER — ACETAMINOPHEN 650 MG RE SUPP
650.0000 mg | Freq: Four times a day (QID) | RECTAL | Status: DC | PRN
Start: 2017-02-14 — End: 2017-02-14

## 2017-02-14 MED ORDER — DIPHENHYDRAMINE HCL 12.5 MG/5ML PO ELIX: 12.5000 mg | ORAL_SOLUTION | Freq: Four times a day (QID) | ORAL | Status: DC | PRN

## 2017-02-14 MED ORDER — DEXTROSE 5 % IV SOLN
2.0000 g | INTRAVENOUS | Status: DC
  Administered 2017-02-15: 2 g via INTRAVENOUS
  Filled 2017-02-14: qty 2

## 2017-02-14 MED ORDER — OXYCODONE HCL 5 MG PO TABS
ORAL_TABLET | ORAL | Status: AC
  Administered 2017-02-14: 10 mg via ORAL
  Filled 2017-02-14: qty 2

## 2017-02-14 MED ORDER — STERILE WATER FOR INJECTION IJ SOLN
INTRAMUSCULAR | Status: DC | PRN
  Administered 2017-02-14: 500 mL

## 2017-02-14 MED ORDER — FENTANYL CITRATE (PF) 250 MCG/5ML IJ SOLN
INTRAMUSCULAR | Status: AC
  Filled 2017-02-14: qty 5

## 2017-02-14 MED ORDER — KETOROLAC TROMETHAMINE 30 MG/ML IJ SOLN
INTRAMUSCULAR | Status: DC | PRN
  Administered 2017-02-14: 30 mg via INTRAVENOUS

## 2017-02-14 MED ORDER — MIDAZOLAM HCL 2 MG/2ML IJ SOLN
INTRAMUSCULAR | Status: AC
  Administered 2017-02-14: 0.5 mg via INTRAVENOUS
  Filled 2017-02-14: qty 2

## 2017-02-14 MED ORDER — MIDAZOLAM HCL 2 MG/2ML IJ SOLN
0.5000 mg | Freq: Once | INTRAMUSCULAR | Status: AC | PRN
  Administered 2017-02-14: 0.5 mg via INTRAVENOUS

## 2017-02-14 MED ORDER — ACETAMINOPHEN 325 MG PO TABS
ORAL_TABLET | ORAL | Status: AC
  Administered 2017-02-14: 650 mg via ORAL
  Filled 2017-02-14: qty 2

## 2017-02-14 MED ORDER — PHENYLEPHRINE HCL 10 MG/ML IJ SOLN
INTRAVENOUS | Status: DC | PRN
  Administered 2017-02-14: 25 ug/min via INTRAVENOUS

## 2017-02-14 MED ORDER — DEXTROSE 5 % IV SOLN: 2.0000 g | INTRAVENOUS | Status: DC

## 2017-02-14 MED ORDER — HYDROMORPHONE HCL 1 MG/ML IJ SOLN: 0.2500 mg | INTRAMUSCULAR | Status: DC | PRN

## 2017-02-14 MED ORDER — ROCURONIUM BROMIDE 100 MG/10ML IV SOLN
INTRAVENOUS | Status: DC | PRN
  Administered 2017-02-14: 40 mg via INTRAVENOUS

## 2017-02-14 MED ORDER — BUPIVACAINE-EPINEPHRINE (PF) 0.5% -1:200000 IJ SOLN
INTRAMUSCULAR | Status: AC
  Filled 2017-02-14: qty 30

## 2017-02-14 MED ORDER — SUGAMMADEX SODIUM 200 MG/2ML IV SOLN
INTRAVENOUS | Status: DC | PRN
  Administered 2017-02-14: 200 mg via INTRAVENOUS

## 2017-02-14 SURGICAL SUPPLY — 61 items
ADH SKN CLS APL DERMABOND .7 (GAUZE/BANDAGES/DRESSINGS)
APL SKNCLS STERI-STRIP NONHPOA (GAUZE/BANDAGES/DRESSINGS) ×1
APPLIER CLIP ROT 10 11.4 M/L (STAPLE)
APR CLP MED LRG 11.4X10 (STAPLE)
BAG SPEC RTRVL 10 TROC 200 (ENDOMECHANICALS)
BANDAGE ADH SHEER 1  50/CT (GAUZE/BANDAGES/DRESSINGS) IMPLANT
BENZOIN TINCTURE PRP APPL 2/3 (GAUZE/BANDAGES/DRESSINGS) ×2 IMPLANT
BLADE CLIPPER SURG (BLADE) IMPLANT
CANISTER SUCT 3000ML PPV (MISCELLANEOUS) ×3 IMPLANT
CHLORAPREP W/TINT 26ML (MISCELLANEOUS) ×3 IMPLANT
CLIP APPLIE ROT 10 11.4 M/L (STAPLE) IMPLANT
CLOSURE STERI-STRIP 1/2X4 (GAUZE/BANDAGES/DRESSINGS) ×1
CLOSURE WOUND 1/2 X4 (GAUZE/BANDAGES/DRESSINGS)
CLSR STERI-STRIP ANTIMIC 1/2X4 (GAUZE/BANDAGES/DRESSINGS) ×1 IMPLANT
COVER SURGICAL LIGHT HANDLE (MISCELLANEOUS) ×3 IMPLANT
CUTTER FLEX LINEAR 45M (STAPLE) ×3 IMPLANT
DERMABOND ADVANCED (GAUZE/BANDAGES/DRESSINGS)
DERMABOND ADVANCED .7 DNX12 (GAUZE/BANDAGES/DRESSINGS) IMPLANT
DRSG TEGADERM 4X4.75 (GAUZE/BANDAGES/DRESSINGS) ×2 IMPLANT
ELECT REM PT RETURN 9FT ADLT (ELECTROSURGICAL) ×3
ELECTRODE REM PT RTRN 9FT ADLT (ELECTROSURGICAL) ×1 IMPLANT
ENDOLOOP SUT PDS II  0 18 (SUTURE)
ENDOLOOP SUT PDS II 0 18 (SUTURE) IMPLANT
GAUZE SPONGE 2X2 8PLY STRL LF (GAUZE/BANDAGES/DRESSINGS) IMPLANT
GLOVE BIO SURGEON STRL SZ7.5 (GLOVE) ×2 IMPLANT
GLOVE BIOGEL M STRL SZ7.5 (GLOVE) ×3 IMPLANT
GLOVE BIOGEL PI IND STRL 8 (GLOVE) ×2 IMPLANT
GLOVE BIOGEL PI INDICATOR 8 (GLOVE) ×4
GOWN STRL REUS W/ TWL LRG LVL3 (GOWN DISPOSABLE) ×2 IMPLANT
GOWN STRL REUS W/TWL 2XL LVL3 (GOWN DISPOSABLE) ×3 IMPLANT
GOWN STRL REUS W/TWL LRG LVL3 (GOWN DISPOSABLE) ×9
GRASPER SUT TROCAR 14GX15 (MISCELLANEOUS) IMPLANT
KIT BASIN OR (CUSTOM PROCEDURE TRAY) ×3 IMPLANT
KIT ROOM TURNOVER OR (KITS) ×3 IMPLANT
NDL HYPO 25GX1X1/2 BEV (NEEDLE) IMPLANT
NEEDLE HYPO 25GX1X1/2 BEV (NEEDLE) ×3 IMPLANT
NS IRRIG 1000ML POUR BTL (IV SOLUTION) ×3 IMPLANT
PAD ARMBOARD 7.5X6 YLW CONV (MISCELLANEOUS) ×6 IMPLANT
POUCH RETRIEVAL ECOSAC 10 (ENDOMECHANICALS) IMPLANT
POUCH RETRIEVAL ECOSAC 10MM (ENDOMECHANICALS)
RELOAD 45 VASCULAR/THIN (ENDOMECHANICALS) IMPLANT
RELOAD STAPLE 45 2.5 WHT GRN (ENDOMECHANICALS) IMPLANT
RELOAD STAPLE 45 3.5 BLU ETS (ENDOMECHANICALS) IMPLANT
RELOAD STAPLE TA45 3.5 REG BLU (ENDOMECHANICALS) ×3 IMPLANT
SCISSORS LAP 5X35 DISP (ENDOMECHANICALS) IMPLANT
SET IRRIG TUBING LAPAROSCOPIC (IRRIGATION / IRRIGATOR) ×3 IMPLANT
SHEARS HARMONIC ACE PLUS 36CM (ENDOMECHANICALS) ×3 IMPLANT
SLEEVE ENDOPATH XCEL 5M (ENDOMECHANICALS) ×3 IMPLANT
SPECIMEN JAR SMALL (MISCELLANEOUS) ×3 IMPLANT
SPONGE GAUZE 2X2 STER 10/PKG (GAUZE/BANDAGES/DRESSINGS) ×2
STRIP CLOSURE SKIN 1/2X4 (GAUZE/BANDAGES/DRESSINGS) IMPLANT
SUT MNCRL AB 4-0 PS2 18 (SUTURE) ×3 IMPLANT
SUT VICRYL 0 UR6 27IN ABS (SUTURE) IMPLANT
TOWEL OR 17X24 6PK STRL BLUE (TOWEL DISPOSABLE) ×3 IMPLANT
TOWEL OR 17X26 10 PK STRL BLUE (TOWEL DISPOSABLE) ×3 IMPLANT
TRAY FOLEY CATH SILVER 16FR (SET/KITS/TRAYS/PACK) ×3 IMPLANT
TRAY LAPAROSCOPIC MC (CUSTOM PROCEDURE TRAY) ×3 IMPLANT
TROCAR XCEL BLUNT TIP 100MML (ENDOMECHANICALS) ×3 IMPLANT
TROCAR XCEL NON-BLD 5MMX100MML (ENDOMECHANICALS) ×3 IMPLANT
TUBING INSUFFLATION (TUBING) ×3 IMPLANT
WATER STERILE IRR 1000ML POUR (IV SOLUTION) ×3 IMPLANT

## 2017-02-14 NOTE — ED Notes (Addendum)
Pt reports that she has had periumbilical pain today with N/V. Pain has since localized to RLQ. Pain is somewhat improved when pt brings knees up.

## 2017-02-14 NOTE — Transfer of Care (Signed)
Immediate Anesthesia Transfer of Care Note  Patient: Sharon Cook  Procedure(s) Performed: APPENDECTOMY LAPAROSCOPIC (N/A )  Patient Location: PACU  Anesthesia Type:General  Level of Consciousness: awake, alert  and oriented  Airway & Oxygen Therapy: Patient Spontanous Breathing and Patient connected to nasal cannula oxygen  Post-op Assessment: Report given to RN and Post -op Vital signs reviewed and stable  Post vital signs: Reviewed and stable  Last Vitals:  Vitals:   02/14/17 0600 02/14/17 0700  BP: 117/66 (!) 106/92  Pulse: 90   Resp: 16   Temp:    SpO2: 98%     Last Pain:  Vitals:   02/14/17 0740  TempSrc:   PainSc: 7       Patients Stated Pain Goal: 2 (57/97/28 2060)  Complications: No apparent anesthesia complications

## 2017-02-14 NOTE — ED Provider Notes (Signed)
Contacted by Dr. Harless Litten from [place], for transfer of this patient who is a 72 year old female who presented there with abdominal pain and found to have acute appendicitis.  She spoke with Dr. Marlou Starks at [facility] who requested the patient be sent here to [facility] to be taken care of by Dr. Redmond Pulling who is the on-call surgeon will await the patient's arrival and call surgery.   Fatima Blank, MD 02/14/17 0157

## 2017-02-14 NOTE — Discharge Instructions (Signed)

## 2017-02-14 NOTE — Op Note (Signed)
Sharon Cook [id number] 04/20/1945 02/14/2017  Appendectomy, Lap, Procedure Note  Indications: The patient presented with a history of right-sided abdominal pain. A ct revealed findings consistent with acute appendicitis.  Pre-operative Diagnosis: Acute appendicitis without mention of peritonitis  Post-operative Diagnosis: suppurative appendicitis  Surgeon: Greer Pickerel   Assistants: Algis Greenhouse RNFA  Anesthesia: General endotracheal anesthesia   Procedure Details  The patient was seen again in the Holding Room. The risks, benefits, complications, treatment options, and expected outcomes were discussed with the patient and/or family. The possibilities of perforation of viscus, bleeding, recurrent infection, the need for additional procedures, failure to diagnose a condition, and creating a complication requiring transfusion or operation were discussed. There was concurrence with the proposed plan and informed consent was obtained. The site of surgery was properly noted. The patient was taken to Operating Room, identified as Sharon Cook and the procedure verified as Appendectomy. A Time Out was held and the above information confirmed.  The patient was placed in the supine position and general anesthesia was induced, along with placement of orogastric tube, SCDs, and a Foley catheter. The abdomen was prepped and draped in a sterile fashion. A 1.5 centimeter infraumbilical incision was made.  The umbilical stalk was elevated, and the midline fascia was incised with a #11 blade.  A Kelly clamp was used to confirm entrance into the peritoneal cavity.  A pursestring suture was passed around the incision with a 0 Vicryl.  A 54mm Hasson was introduced into the abdomen and the tails of the suture were used to hold the Hasson in place.   The pneumoperitoneum was then established to steady pressure of 15 mmHg.  Additional 5 mm cannulas then placed in the left lower quadrant of the abdomen and  the suprapubic region under direct visualization. She had some omental adhesions in the lower midline which were left alone. We were able to see the RLQ.  A careful evaluation of the entire abdomen was carried out. The patient was placed in Trendelenburg and left lateral decubitus position. The small intestines were retracted in the cephalad and left lateral direction away from the pelvis and right lower quadrant. The patient was found to have an inflammed appendix that was extending into the pelvis. There was no evidence of perforation but there was some mild purulent fluid around the appendix  The appendix was carefully dissected. The appendix was was skeletonized with the harmonic scalpel.   The appendix was divided at its base using an endo-GIA stapler with a blue load. No appendiceal stump was left in place. The appendix was removed from the abdomen with an Endocatch bag through the umbilical port.  There was no evidence of bleeding, leakage, or complication after division of the appendix. Irrigation was also performed and irrigate suctioned from the abdomen as well.  The umbilical port site was closed with the purse string suture. The closure was viewed laparoscopically. There was no residual palpable fascial defect.  The trocar site skin wounds were closed with 4-0 Monocryl. Dermabond was applied to the skin incisions.  Instrument, sponge, and needle counts were correct at the conclusion of the case.   Findings: The appendix was found to be inflamed. There were not signs of necrosis.  There was not perforation. There was not abscess formation. There was some purulent fluid around the appendix.  Estimated Blood Loss:  Minimal         Drains: none         Specimens: appendix  Complications:  None; patient tolerated the procedure well.         Disposition: PACU - hemodynamically stable.         Condition: stable  Leighton Ruff. Redmond Pulling, MD, FACS General, Bariatric, & Minimally Invasive  Surgery [facility], [place]

## 2017-02-14 NOTE — ED Notes (Signed)
Pt's O2 saturations dropped following fentanyl dose, places on 2L nasal canula, sats at 98% currently

## 2017-02-14 NOTE — ED Notes (Signed)
Pt last had something to eat at 1300 on 02/13/17, last had a few sips of ginger ale around 1830 also.  Belongings sent home with husband, but patient still has her contact lenses in.

## 2017-02-14 NOTE — ED Provider Notes (Signed)
[place] EMERGENCY DEPARTMENT Provider Note   CSN: [id number] Arrival date & time: 02/13/17  2248     History   Chief Complaint Chief Complaint  Patient presents with  . Abdominal Pain    HPI HODA HON is a 72 y.o. female.  The history is provided by the patient. No language interpreter was used.  Abdominal Pain   This is a new problem. The current episode started yesterday. The problem occurs constantly. The problem has not changed since onset.The pain is associated with an unknown factor. The pain is located in the RLQ. The quality of the pain is sharp. The pain is at a severity of 10/10. The pain is severe. Associated symptoms include nausea and vomiting. Pertinent negatives include diarrhea and hematuria. Nothing aggravates the symptoms. Nothing relieves the symptoms. Past workup does not include GI consult. Her past medical history does not include PUD.  Started earlier today around mid abdomen, now worse on the R side.  Associated nausea and vomiting.  No diarrhea.   No urinary symptoms  History reviewed. No pertinent past medical history.  Patient Active Problem List   Diagnosis Date Noted  . FIBROIDS, UTERUS 03/02/2008  . HYPERLIPIDEMIA 03/02/2008  . EOSINOPHILIA 03/02/2008  . ALLERGIC RHINITIS 03/02/2008  . G E R D 03/02/2008  . COUGH 03/02/2008    Past Surgical History:  Procedure Laterality Date  . ABDOMINAL HYSTERECTOMY    . CESAREAN SECTION     twice    OB History    No data available       Home Medications    Prior to Admission medications   Not on File    Family History No family history on file.  Social History Social History   Tobacco Use  . Smoking status: Not on file  Substance Use Topics  . Alcohol use: Not on file  . Drug use: Not on file     Allergies   Codeine   Review of Systems Review of Systems  Respiratory: Negative for shortness of breath.   Cardiovascular: Negative for chest pain.    Gastrointestinal: Positive for abdominal pain, nausea and vomiting. Negative for diarrhea.  Genitourinary: Negative for hematuria.  All other systems reviewed and are negative.    Physical Exam Updated Vital Signs BP (!) 151/75 (BP Location: Left Arm)   Pulse (!) 105   Temp 98.8 F (37.1 C) (Oral)   Resp 18   Ht 5\' 2"  (1.575 m)   Wt 70.3 kg (155 lb)   SpO2 96%   BMI 28.35 kg/m   Physical Exam  Constitutional: She is oriented to person, place, and time. She appears well-developed and well-nourished. No distress.  HENT:  Head: Normocephalic and atraumatic.  Mouth/Throat: No oropharyngeal exudate.  Eyes: Conjunctivae and EOM are normal. Pupils are equal, round, and reactive to light.  Neck: Normal range of motion. Neck supple.  Cardiovascular: Normal rate, regular rhythm, normal heart sounds and intact distal pulses.  Pulmonary/Chest: Effort normal and breath sounds normal. No respiratory distress. She has no wheezes.  Abdominal: Bowel sounds are normal. There is tenderness. There is guarding and tenderness at McBurney's point. There is no rigidity.  Musculoskeletal: Normal range of motion.  Neurological: She is alert and oriented to person, place, and time. She displays normal reflexes.  Skin: Capillary refill takes less than 2 seconds.  Psychiatric: She has a normal mood and affect.  Nursing note and vitals reviewed.    ED Treatments / Results  Labs (all labs ordered are listed, but only abnormal results are displayed) Labs Reviewed  COMPREHENSIVE METABOLIC PANEL - Abnormal; Notable for the following components:      Result Value   Chloride 99 (*)    Glucose, Bld 150 (*)    BUN 25 (*)    Total Protein 8.2 (*)    All other components within normal limits  CBC - Abnormal; Notable for the following components:   WBC 13.7 (*)    All other components within normal limits  URINALYSIS, ROUTINE W REFLEX MICROSCOPIC - Abnormal; Notable for the following components:    APPearance CLOUDY (*)    Hgb urine dipstick TRACE (*)    All other components within normal limits  URINALYSIS, MICROSCOPIC (REFLEX) - Abnormal; Notable for the following components:   Bacteria, UA FEW (*)    Squamous Epithelial / LPF 0-5 (*)    All other components within normal limits  LIPASE, BLOOD    EKG  EKG Interpretation None       Radiology Ct Abdomen Pelvis W Contrast  Result Date: 02/14/2017 CLINICAL DATA:  72 year old female with abdominal pain. EXAM: CT ABDOMEN AND PELVIS WITH CONTRAST TECHNIQUE: Multidetector CT imaging of the abdomen and pelvis was performed using the standard protocol following bolus administration of intravenous contrast. CONTRAST:  117mL ISOVUE-300 IOPAMIDOL (ISOVUE-300) INJECTION 61% COMPARISON:  None. FINDINGS: Lower chest: The visualized lung bases are clear. No intra-abdominal free air or free fluid. Hepatobiliary: No focal liver abnormality is seen. No gallstones, gallbladder wall thickening, or biliary dilatation. Pancreas: Unremarkable. No pancreatic ductal dilatation or surrounding inflammatory changes. Spleen: Normal in size without focal abnormality. Adrenals/Urinary Tract: The adrenal glands are unremarkable. There is a 3 mm right renal interpolar nonobstructing stone. There is no hydronephrosis on either side. There is symmetric enhancement and excretion of contrast by both kidneys. The visualized ureters and urinary bladder appear unremarkable. Stomach/Bowel: Small scattered sigmoid diverticula. No active inflammatory changes. There is a small hiatal hernia. There is no bowel obstruction the appendix is enlarged and inflamed. The appendix is located in the right lower quadrant inferior to the cecum. There is mild enhancement of the appendiceal mucosa. There is a 9 mm stone at the base of the appendix. A smaller stone is also noted in the mid lumen of the appendix. The appendix measures approximately 12 mm in diameter. No evidence of perforation or  abscess. Vascular/Lymphatic: No significant vascular findings are present. No enlarged abdominal or pelvic lymph nodes. Reproductive: Hysterectomy.  No pelvic mass. Other: None Musculoskeletal: Mild degenerative changes of the spine. No acute osseous pathology. IMPRESSION: 1. Acute appendicitis.  No abscess or perforation. 2. Mild sigmoid diverticulosis. 3. A 3 mm nonobstructing right renal stone.  No hydronephrosis. Electronically Signed   By: Anner Crete M.D.   On: 02/14/2017 01:32    Procedures Procedures (including critical care time)  Medications Ordered in ED Medications  cefTRIAXone (ROCEPHIN) 2 g in dextrose 5 % 50 mL IVPB (not administered)    And  metroNIDAZOLE (FLAGYL) IVPB 500 mg (not administered)  0.9 %  sodium chloride infusion ( Intravenous New Bag/Given 02/14/17 0150)  ondansetron (ZOFRAN) injection 4 mg (4 mg Intravenous Given 02/13/17 2358)  iopamidol (ISOVUE-300) 61 % injection 100 mL (100 mLs Intravenous Contrast Given 02/14/17 0048)  fentaNYL (SUBLIMAZE) injection 100 mcg (100 mcg Intravenous Given 02/14/17 0150)     Case d/w Dr. Marlou Starks send to [facility] ED to be seen and admitted by [facility].    Final Clinical  Impressions(s) / ED Diagnoses   Final diagnoses:  Acute appendicitis, unspecified acute appendicitis type    Sent to [place]  [facility] Wilder, Aaminah Forrester, MD 02/14/17 7618

## 2017-02-14 NOTE — Interval H&P Note (Signed)
History and Physical Interval Note:  02/14/2017 8:33 AM  Sharon Cook  has presented today for surgery, with the diagnosis of acute appy  The various methods of treatment have been discussed with the patient and family. After consideration of risks, benefits and other options for treatment, the patient has consented to  Procedure(s): APPENDECTOMY LAPAROSCOPIC (N/A) as a surgical intervention .  The patient's history has been reviewed, patient examined, no change in status, stable for surgery.  I have reviewed the patient's chart and labs.  Questions were answered to the patient's satisfaction.    Pt seen and examined Nontoxic Soft, ++RLQ TTP  We discussed the etiology and management of acute appendicitis. We discussed operative and nonoperative management.  I recommended operative management along with IV antibiotics.  We discussed laparoscopic appendectomy. We discussed the risk and benefits of surgery including but not limited to bleeding, infection, injury to surrounding structures, need to convert to an open procedure, blood clot formation, post operative abscess or wound infection, staple line complications such as leak or bleeding, hernia formation, post operative ileus, need for additional procedures, anesthesia complications, and the typical postoperative course. I explained that the patient should expect a good improvement in their symptoms.   Greer Pickerel

## 2017-02-14 NOTE — Anesthesia Procedure Notes (Signed)
Procedure Name: Intubation Date/Time: 02/14/2017 9:15 AM Performed by: Mariea Clonts, CRNA Pre-anesthesia Checklist: Patient identified, Emergency Drugs available, Suction available and Patient being monitored Patient Re-evaluated:Patient Re-evaluated prior to induction Oxygen Delivery Method: Circle System Utilized Preoxygenation: Pre-oxygenation with 100% oxygen Induction Type: IV induction Ventilation: Mask ventilation without difficulty Laryngoscope Size: Miller and 2 Grade View: Grade II Tube type: Oral Number of attempts: 1 Airway Equipment and Method: Stylet and Oral airway Placement Confirmation: ETT inserted through vocal cords under direct vision,  positive ETCO2 and breath sounds checked- equal and bilateral Tube secured with: Tape Dental Injury: Teeth and Oropharynx as per pre-operative assessment

## 2017-02-14 NOTE — H&P (Signed)
Sharon Cook is an 71 y.o. female.   Chief Complaint: abdominal pain HPI: 71 yo female with 1 day history of abdominal pain. Pain started diffusely and then over the day worsened and localized to her right lower quadrant. She has multiple episodes of nausea and vomiting. She denies diarrhea or constipation. She has never had a similar pain. She last had something to eat at 2pm.  Past Medical History:  Diagnosis Date  . Post-operative nausea and vomiting    per patient     Past Surgical History:  Procedure Laterality Date  . ABDOMINAL HYSTERECTOMY    . CESAREAN SECTION     twice    No family history on file. Social History:  reports that  has never smoked. she has never used smokeless tobacco. Her alcohol and drug histories are not on file.  Allergies:  Allergies  Allergen Reactions  . Codeine Nausea And Vomiting     (Not in a hospital admission)  Results for orders placed or performed during the hospital encounter of 02/13/17 (from the past 48 hour(s))  Urinalysis, Routine w reflex microscopic     Status: Abnormal   Collection Time: 02/13/17 11:00 PM  Result Value Ref Range   Color, Urine YELLOW YELLOW   APPearance CLOUDY (A) CLEAR   Specific Gravity, Urine 1.015 1.005 - 1.030   pH 7.0 5.0 - 8.0   Glucose, UA NEGATIVE NEGATIVE mg/dL   Hgb urine dipstick TRACE (A) NEGATIVE   Bilirubin Urine NEGATIVE NEGATIVE   Ketones, ur NEGATIVE NEGATIVE mg/dL   Protein, ur NEGATIVE NEGATIVE mg/dL   Nitrite NEGATIVE NEGATIVE   Leukocytes, UA NEGATIVE NEGATIVE  Urinalysis, Microscopic (reflex)     Status: Abnormal   Collection Time: 02/13/17 11:00 PM  Result Value Ref Range   RBC / HPF 0-5 0 - 5 RBC/hpf   WBC, UA NONE SEEN 0 - 5 WBC/hpf   Bacteria, UA FEW (A) NONE SEEN   Squamous Epithelial / LPF 0-5 (A) NONE SEEN   Amorphous Crystal PRESENT   Lipase, blood     Status: None   Collection Time: 02/13/17 11:47 PM  Result Value Ref Range   Lipase 31 11 - 51 U/L  Comprehensive  metabolic panel     Status: Abnormal   Collection Time: 02/13/17 11:47 PM  Result Value Ref Range   Sodium 137 135 - 145 mmol/L   Potassium 3.5 3.5 - 5.1 mmol/L   Chloride 99 (L) 101 - 111 mmol/L   CO2 28 22 - 32 mmol/L   Glucose, Bld 150 (H) 65 - 99 mg/dL   BUN 25 (H) 6 - 20 mg/dL   Creatinine, Ser 0.90 0.44 - 1.00 mg/dL   Calcium 9.4 8.9 - 10.3 mg/dL   Total Protein 8.2 (H) 6.5 - 8.1 g/dL   Albumin 4.4 3.5 - 5.0 g/dL   AST 26 15 - 41 U/L   ALT 22 14 - 54 U/L   Alkaline Phosphatase 51 38 - 126 U/L   Total Bilirubin 0.5 0.3 - 1.2 mg/dL   GFR calc non Af Amer >60 >60 mL/min   GFR calc Af Amer >60 >60 mL/min    Comment: (NOTE) The eGFR has been calculated using the CKD EPI equation. This calculation has not been validated in all clinical situations. eGFR's persistently <60 mL/min signify possible Chronic Kidney Disease.    Anion gap 10 5 - 15  CBC     Status: Abnormal   Collection Time: 02/13/17 11:47 PM    Result Value Ref Range   WBC 13.7 (H) 4.0 - 10.5 K/uL   RBC 4.62 3.87 - 5.11 MIL/uL   Hemoglobin 12.9 12.0 - 15.0 g/dL   HCT 38.8 36.0 - 46.0 %   MCV 84.0 78.0 - 100.0 fL   MCH 27.9 26.0 - 34.0 pg   MCHC 33.2 30.0 - 36.0 g/dL   RDW 13.6 11.5 - 15.5 %   Platelets 237 150 - 400 K/uL   Ct Abdomen Pelvis W Contrast  Result Date: 02/14/2017 CLINICAL DATA:  71-year-old female with abdominal pain. EXAM: CT ABDOMEN AND PELVIS WITH CONTRAST TECHNIQUE: Multidetector CT imaging of the abdomen and pelvis was performed using the standard protocol following bolus administration of intravenous contrast. CONTRAST:  100mL ISOVUE-300 IOPAMIDOL (ISOVUE-300) INJECTION 61% COMPARISON:  None. FINDINGS: Lower chest: The visualized lung bases are clear. No intra-abdominal free air or free fluid. Hepatobiliary: No focal liver abnormality is seen. No gallstones, gallbladder wall thickening, or biliary dilatation. Pancreas: Unremarkable. No pancreatic ductal dilatation or surrounding inflammatory  changes. Spleen: Normal in size without focal abnormality. Adrenals/Urinary Tract: The adrenal glands are unremarkable. There is a 3 mm right renal interpolar nonobstructing stone. There is no hydronephrosis on either side. There is symmetric enhancement and excretion of contrast by both kidneys. The visualized ureters and urinary bladder appear unremarkable. Stomach/Bowel: Small scattered sigmoid diverticula. No active inflammatory changes. There is a small hiatal hernia. There is no bowel obstruction the appendix is enlarged and inflamed. The appendix is located in the right lower quadrant inferior to the cecum. There is mild enhancement of the appendiceal mucosa. There is a 9 mm stone at the base of the appendix. A smaller stone is also noted in the mid lumen of the appendix. The appendix measures approximately 12 mm in diameter. No evidence of perforation or abscess. Vascular/Lymphatic: No significant vascular findings are present. No enlarged abdominal or pelvic lymph nodes. Reproductive: Hysterectomy.  No pelvic mass. Other: None Musculoskeletal: Mild degenerative changes of the spine. No acute osseous pathology. IMPRESSION: 1. Acute appendicitis.  No abscess or perforation. 2. Mild sigmoid diverticulosis. 3. A 3 mm nonobstructing right renal stone.  No hydronephrosis. Electronically Signed   By: Arash  Radparvar M.D.   On: 02/14/2017 01:32    Review of Systems  Constitutional: Negative for chills and fever.  HENT: Negative for hearing loss.   Eyes: Negative for blurred vision and double vision.  Respiratory: Negative for cough and hemoptysis.   Cardiovascular: Negative for chest pain and palpitations.  Gastrointestinal: Positive for abdominal pain, nausea and vomiting.  Genitourinary: Negative for dysuria and urgency.  Musculoskeletal: Negative for myalgias and neck pain.  Skin: Negative for itching and rash.  Neurological: Negative for dizziness, tingling and headaches.  Endo/Heme/Allergies:  Does not bruise/bleed easily.  Psychiatric/Behavioral: Negative for depression and suicidal ideas.    Blood pressure (!) 120/56, pulse 96, temperature 98 F (36.7 C), temperature source Oral, resp. rate 17, height 5' 2" (1.575 m), weight 70.3 kg (155 lb), SpO2 97 %. Physical Exam  Vitals reviewed. Constitutional: She is oriented to person, place, and time. She appears well-developed and well-nourished.  HENT:  Head: Normocephalic and atraumatic.  Eyes: Conjunctivae and EOM are normal. Pupils are equal, round, and reactive to light.  Neck: Normal range of motion. Neck supple.  Cardiovascular: Normal rate and regular rhythm.  Respiratory: Effort normal and breath sounds normal.  GI: Soft. Bowel sounds are normal. She exhibits no distension. There is tenderness in the right lower quadrant. There   is guarding.  Musculoskeletal: Normal range of motion.  Neurological: She is alert and oriented to person, place, and time.  Skin: Skin is warm and dry.  Psychiatric: She has a normal mood and affect. Her behavior is normal.     Assessment/Plan 71 yo female with acute appendicitis -admit to surgery -we discussed the details of the procedure; that it would be done under general anesthesia, that we would attempt to do the procedure laparoscopically. That the appendix would be isolated from the large and small intestine and then ligated and removed. We discussed the reason for this is to avoid rupture and infection and resolve the pains. We discussed risks of infection, abscess, injury to intestines or urinary structures, and need for open incision. She showed good understanding and wanted to proceed. -OR today -IV abx -pain control  Luke Aaron Kinsinger, MD 02/14/2017, 4:18 AM   

## 2017-02-14 NOTE — Anesthesia Postprocedure Evaluation (Signed)
Anesthesia Post Note  Patient: Sharon Cook  Procedure(s) Performed: APPENDECTOMY LAPAROSCOPIC (N/A )     Patient location during evaluation: PACU Anesthesia Type: General Level of consciousness: awake and alert Pain management: pain level controlled Vital Signs Assessment: post-procedure vital signs reviewed and stable Respiratory status: spontaneous breathing, nonlabored ventilation, respiratory function stable and patient connected to nasal cannula oxygen Cardiovascular status: blood pressure returned to baseline and stable Postop Assessment: no apparent nausea or vomiting Anesthetic complications: no    Last Vitals:  Vitals:   02/14/17 1022 02/14/17 1030  BP: 124/72 127/64  Pulse: 89 90  Resp: 16 15  Temp: 36.6 C   SpO2: 95% 94%    Last Pain:  Vitals:   02/14/17 0740  TempSrc:   PainSc: 7                  Kabao Leite,W. EDMOND

## 2017-02-14 NOTE — ED Notes (Signed)
Pt taken to OR.

## 2017-02-14 NOTE — Anesthesia Preprocedure Evaluation (Addendum)
Anesthesia Evaluation  Patient identified by MRN, date of birth, ID band Patient awake    Reviewed: Allergy & Precautions, NPO status , Patient's Chart, lab work & pertinent test results  History of Anesthesia Complications (+) PONV  Airway Mallampati: II  TM Distance: >3 FB Neck ROM: Full    Dental  (+) Dental Advisory Given   Pulmonary neg pulmonary ROS,    breath sounds clear to auscultation       Cardiovascular hypertension, Pt. on medications (-) angina Rhythm:Regular Rate:Normal     Neuro/Psych negative neurological ROS     GI/Hepatic Neg liver ROS, GERD  Poorly Controlled,N/V with Acute appy   Endo/Other  negative endocrine ROS  Renal/GU negative Renal ROS     Musculoskeletal   Abdominal   Peds  Hematology negative hematology ROS (+)   Anesthesia Other Findings   Reproductive/Obstetrics                            Anesthesia Physical Anesthesia Plan  ASA: II  Anesthesia Plan: General   Post-op Pain Management:    Induction: Intravenous and Rapid sequence  PONV Risk Score and Plan: 4 or greater and Scopolamine patch - Pre-op, Dexamethasone and Ondansetron  Airway Management Planned: Oral ETT  Additional Equipment:   Intra-op Plan:   Post-operative Plan: Extubation in OR  Informed Consent: I have reviewed the patients History and Physical, chart, labs and discussed the procedure including the risks, benefits and alternatives for the proposed anesthesia with the patient or authorized representative who has indicated his/her understanding and acceptance.   Dental advisory given  Plan Discussed with: CRNA and Surgeon  Anesthesia Plan Comments: (Plan routine monitors, GETA)        Anesthesia Quick Evaluation

## 2017-02-14 NOTE — ED Provider Notes (Addendum)
3:35 AM  Pt is a 72 year old female who was transferred from [facility] for acute appendicitis.  Patient has been given Rocephin and Flagyl.  Currently hemodynamically stable.  Will discuss with on-call surgeon for admission.  No significant pain at this time.  Declines any further pain medication.  She is n.p.o.  3:45 AM  Dr. Reece Agar with general surgery aware of patient.   Naszir Cott, Delice Bison, DO 02/14/17 [place], Delice Bison, DO 02/14/17 [phone]

## 2017-02-15 ENCOUNTER — Encounter (HOSPITAL_COMMUNITY): Payer: Self-pay | Admitting: General Surgery

## 2017-02-15 LAB — GLUCOSE, CAPILLARY: GLUCOSE-CAPILLARY: 107 mg/dL — AB (ref 65–99)

## 2017-02-15 NOTE — Care Management Note (Signed)
Case Management Note  Patient Details  Name: Sharon Cook MRN: [id number] Date of Birth: 07/13/1945  Subjective/Objective:                 Patient with order to DC to home today. Chart reviewed. No Home Health or Equipment needs, no unacknowledged Case Management consults or medication needs identified at the time of this note. Plan for DC to home.  CM signing off. If new needs arise today prior to discharge, please call Carles Collet RN CM at [phone].   Action/Plan:   Expected Discharge Date:  02/15/17               Expected Discharge Plan:  Home/Self Care  In-House Referral:     Discharge planning Services  CM Consult  Post Acute Care Choice:    Choice offered to:     DME Arranged:    DME Agency:     HH Arranged:    HH Agency:     Status of Service:  Completed, signed off  If discussed at [employer] of Stay Meetings, dates discussed:    Additional Comments:  Carles Collet, RN 02/15/2017, 9:26 AM

## 2017-02-15 NOTE — Progress Notes (Signed)
Patient underwent surgery on 02/14/2017. Patient will be excused from work on January 21-22.  Thereafter she will be able to return to work on half days with no heavy lifting greater than 20 pounds for 6 weeks.  Patient to follow back up with [place] in clinic  Ralene Ok, MD [facility], [place] [facility] Trauma & Emergency Surgery

## 2017-02-15 NOTE — Discharge Summary (Signed)
Physician Discharge Summary  Patient ID: Sharon Cook MRN: [id number] DOB/AGE: 1945/05/29 72 y.o.  Admit date: 02/13/2017 Discharge date: 02/15/2017  Admission Diagnoses: Status post lap appendectomy  Discharge Diagnoses:  Active Problems:   Acute appendicitis   Discharged Condition: good  Hospital Course: Patient was admitted with acute appendicitis.  She underwent laparoscopic appendectomy.  Please see operative note for details.  Patient postop and was sent to the floor.  She was having good pain control on the floor took minimal pain medication.  She was started on liquid diet advance to regular diet which she was able to tolerate well.  She is otherwise M doing well on her own.  She was deemed stable for discharge and discharged home.  Consults: None  Significant Diagnostic Studies: none  Treatments: surgery: as above  Discharge Exam: Blood pressure (!) 113/51, pulse 63, temperature 98.1 F (36.7 C), temperature source Oral, resp. rate 16, height 5\' 2"  (1.575 m), weight 70.3 kg (155 lb), SpO2 94 %. General appearance: alert and cooperative GI: soft, non-tender; bowel sounds normal; no masses,  no organomegaly and inc c/d/i  Disposition: 01-Home or Self Care  Discharge Instructions    Diet - low sodium heart healthy   Complete by:  As directed    Increase activity slowly   Complete by:  As directed      Allergies as of 02/15/2017      Reactions   Codeine Nausea And Vomiting      Medication List    TAKE these medications   calcium-vitamin D 500-200 MG-UNIT tablet Take 2 tablets by mouth daily.   CoQ10 200 MG Caps Take 1 tablet by mouth daily.   HAIR/SKIN/NAILS/BIOTIN PO Take 1 tablet by mouth daily.   hydrochlorothiazide 25 MG tablet Commonly known as:  HYDRODIURIL Take 25 mg by mouth daily.   magnesium oxide 400 (241.3 Mg) MG tablet Commonly known as:  MAG-OX Take 400 mg by mouth daily.   omega-3 acid ethyl esters 1 g capsule Commonly known as:   LOVAZA Take 2 g by mouth daily.      Follow-up Information    [facility] on 02/27/2017.   Specialty:  General Surgery Why:  Your appointment is at 1:45 PM. Please arrive 30 min prior to appointment time. Bring photo ID and insurance information with you.  Contact information: [address] [address] [phone]           Signed: Rosario Jacks., [facility] 02/15/2017, 9:24 AM

## 2017-02-15 NOTE — Progress Notes (Signed)
Pt for discharge today going home, discontinued peripheral IV line, health teachings, prescriptions, next appointment, dressing changed, no complain of pain, given all her personal belongings, no s/s of distress noted, accompanied by her husband and nurse tech.

## 2017-02-17 NOTE — Consult Note (Addendum)
            [facility] CM Primary Care Navigator  02/17/2017  Sharon Cook 1945-02-26 [id number]   Attempt to see patient at the bedside to identify possible discharge needs but she was already discharged.  Patient was admitted and treated for acute appendicitis. (Status post lap appendectomy) She was discharged home over the weekend.  Primary care provider's office (Dr. Annie Main Meyers/ Loma Sousa Focucci with [place] [facility]) is listed as providing transition of care (TOC).  Patient has discharge instruction tofollow-up with general surgery [facility]) on 02/27/2017 at 1:45 pm.   For additional questions please contact:  Edwena Felty A. Boston Catarino, BSN, RN-BC [facility] PRIMARY CARE Navigator Cell: [phone]

## 2017-03-27 DIAGNOSIS — H5203 Hypermetropia, bilateral: Secondary | ICD-10-CM | POA: Diagnosis not present

## 2017-03-27 DIAGNOSIS — H524 Presbyopia: Secondary | ICD-10-CM | POA: Diagnosis not present

## 2017-03-27 DIAGNOSIS — H2513 Age-related nuclear cataract, bilateral: Secondary | ICD-10-CM | POA: Diagnosis not present

## 2017-03-27 DIAGNOSIS — H52223 Regular astigmatism, bilateral: Secondary | ICD-10-CM | POA: Diagnosis not present

## 2017-04-08 DIAGNOSIS — L821 Other seborrheic keratosis: Secondary | ICD-10-CM | POA: Diagnosis not present

## 2017-04-08 DIAGNOSIS — L570 Actinic keratosis: Secondary | ICD-10-CM | POA: Diagnosis not present

## 2017-04-08 DIAGNOSIS — D229 Melanocytic nevi, unspecified: Secondary | ICD-10-CM | POA: Diagnosis not present

## 2017-05-22 DIAGNOSIS — D229 Melanocytic nevi, unspecified: Secondary | ICD-10-CM | POA: Diagnosis not present

## 2017-05-22 DIAGNOSIS — L820 Inflamed seborrheic keratosis: Secondary | ICD-10-CM | POA: Diagnosis not present

## 2017-08-05 DIAGNOSIS — R079 Chest pain, unspecified: Secondary | ICD-10-CM | POA: Diagnosis not present

## 2017-08-19 ENCOUNTER — Other Ambulatory Visit: Payer: Self-pay

## 2017-08-19 ENCOUNTER — Encounter: Payer: Self-pay | Admitting: *Deleted

## 2017-08-19 DIAGNOSIS — E2839 Other primary ovarian failure: Secondary | ICD-10-CM | POA: Insufficient documentation

## 2017-08-19 DIAGNOSIS — E559 Vitamin D deficiency, unspecified: Secondary | ICD-10-CM | POA: Insufficient documentation

## 2017-08-19 DIAGNOSIS — K200 Eosinophilic esophagitis: Secondary | ICD-10-CM | POA: Insufficient documentation

## 2017-08-19 DIAGNOSIS — E7800 Pure hypercholesterolemia, unspecified: Secondary | ICD-10-CM | POA: Insufficient documentation

## 2017-08-19 DIAGNOSIS — I10 Essential (primary) hypertension: Secondary | ICD-10-CM | POA: Insufficient documentation

## 2017-09-02 ENCOUNTER — Encounter: Payer: Self-pay | Admitting: Physician Assistant

## 2017-09-02 ENCOUNTER — Encounter

## 2017-09-02 ENCOUNTER — Ambulatory Visit (INDEPENDENT_AMBULATORY_CARE_PROVIDER_SITE_OTHER): Payer: Medicare Other | Admitting: Physician Assistant

## 2017-09-02 VITALS — BP 132/80 | HR 70 | Ht 62.5 in | Wt 162.6 lb

## 2017-09-02 DIAGNOSIS — R079 Chest pain, unspecified: Secondary | ICD-10-CM

## 2017-09-02 DIAGNOSIS — E7800 Pure hypercholesterolemia, unspecified: Secondary | ICD-10-CM | POA: Diagnosis not present

## 2017-09-02 DIAGNOSIS — R072 Precordial pain: Secondary | ICD-10-CM

## 2017-09-02 DIAGNOSIS — I10 Essential (primary) hypertension: Secondary | ICD-10-CM | POA: Diagnosis not present

## 2017-09-02 MED ORDER — METOPROLOL TARTRATE 50 MG PO TABS: 50.0000 mg | ORAL_TABLET | Freq: Once | ORAL | 0 refills | Status: DC

## 2017-09-02 NOTE — Patient Instructions (Addendum)
Medication Instructions:   CONTINUE TO TAKE BABY ASPRIN .81 MG ONCE A DAY   TAKE  METOPROLOL 50 MG ONE HOUR PRIOR TO PROCEDURE    If you need a refill on your cardiac medications before your next appointment, please call your pharmacy.  Labwork:  BMET  TODAY    Testing/Procedures: Non-Cardiac CT Angiography (CTA), is a special type of CT scan that uses a computer to produce multi-dimensional views of major blood vessels throughout the body. In CT angiography, a contrast material is injected through an IV to help visualize the blood vessels    Follow-Up:  FIRST AVAILABLE DR CRENSHAW   Any Other Special Instructions Will Be Listed Below (If Applicable). Please arrive at the [facility] main entrance of [facility] at xx:xx AM (30-45 minutes prior to test start time)  [facility] [place], [place] [address] [phone]  Proceed to the [facility] Radiology Department (First Floor).  Please follow these instructions carefully (unless otherwise directed):  Hold all erectile dysfunction medications at least 48 hours prior to test.  On the Night Before the Test: . Drink plenty of water. . Do not consume any caffeinated/decaffeinated beverages or chocolate 12 hours prior to your test. . Do not take any antihistamines 12 hours prior to your test.   On the Day of the Test: . Drink plenty of water. Do not drink any water within one hour of the test. . Do not eat any food 4 hours prior to the test. . You may take your regular medications prior to the test. . IF NOT ON A BETA BLOCKER - Take 50 mg of lopressor (metoprolol) one hour before the test. . HOLD Furosemide morning of the test.  After the Test: . Drink plenty of water. . After receiving IV contrast, you may experience a mild flushed feeling. This is normal. . On occasion, you may experience a mild rash up to 24 hours after the test. This is not dangerous. If this occurs, you can take  Benadryl 25 mg and increase your fluid intake. . If you experience trouble breathing, this can be serious. If it is severe call 911 IMMEDIATELY. If it is mild, please call our office. . If you take any of these medications: Glipizide/Metformin, Avandament, Glucavance, please do not take 48 hours after completing test.

## 2017-09-02 NOTE — Progress Notes (Signed)
Cardiology Office Note   Date:  09/02/2017   ID:  Sharon Cook, Sharon Cook, Sharon Cook, MRN [id number]  PCP:  Orpah Melter, MD  Cardiologist: Dr. Stanford Breed, new today Rosaria Ferries, PA-C   Chief Complaint  Patient presents with  . New Patient (Initial Visit)    chest pains come and go last episode several weeks ago, family history of heart attack, denies SOB, mentions slight swelling in ankles noted.    History of Present Illness: Sharon Cook is a 72 y.o. female with a history of HTN, GERD, HLD, vitamin D deficiency eosinophilic esophagitis, appendicitis 01/2017.  [place] November presents for cardiology evaluation.   She has been under major stress since [employer] bought the [employer], including [employer].   She developed chest pain about 6 months ago. It is a tightness that starts on the L side of her chest and goes around under her L breast. She also gets a burning sensation. The worst one was 6-7/10. The tightness might be more severe briefly, then ease off. However, it might last hours at a low level. 6-7 episodes total.  She walks fast and has to go up steps at times. She does yard work.   She has not had the chest pain with exertion. Sometimes she will feel it all day long, but it can be worse in the evenings.  Has not tried any medication for the pain.   Dr Olen Pel started her back on the Prilosec. She has not had an episode since she started back on the Prilosec.   No blood in BM/Urine. Has had dark stools in the past, none in 6 months or more. Occ hemorrhoidal bleeding.     Past Medical History:  Diagnosis Date  . Acute appendicitis 02/14/2017  . Eosinophilic esophagitis   . Estrogen deficiency   . GERD without esophagitis   . Hypertension, essential 2013  . Post-operative nausea and vomiting    per patient   . Pure hypercholesterolemia    problems tolerating statins>>rash  . Rhinitis due to pollen   . Vitamin D deficiency      Past Surgical History:  Procedure Laterality Date  . ABDOMINAL HYSTERECTOMY    . CESAREAN SECTION     twice  . DILATION AND CURETTAGE OF UTERUS  08/1998  . ESOPHAGUS STRETCHED  03/2010  . LAPAROSCOPIC APPENDECTOMY N/A 02/14/2017   Procedure: APPENDECTOMY LAPAROSCOPIC;  Surgeon: Greer Pickerel, MD;  Location: [facility] OR;  Service: General;  Laterality: N/A;    Current Outpatient Medications  Medication Sig Dispense Refill  . Calcium Carb-Cholecalciferol (CALCIUM-VITAMIN D) 500-200 MG-UNIT tablet Take 2 tablets by mouth daily.    . Coenzyme Q10 (COQ10) 200 MG CAPS Take 1 tablet by mouth daily.    . hydrochlorothiazide (HYDRODIURIL) 25 MG tablet Take 25 mg by mouth daily.    . magnesium oxide (MAG-OX) 400 (241.3 Mg) MG tablet Take 400 mg by mouth daily.    . Multiple Vitamins-Minerals (HAIR/SKIN/NAILS/BIOTIN PO) Take 1 tablet by mouth daily.    . Omega-3 Fatty Acids (FISH OIL) 1000 MG CAPS Take 2 capsules by mouth daily.      No current facility-administered medications for this visit.     Allergies:   Codeine; Simvastatin; and Atorvastatin    Social History:  The patient  reports that she has never smoked. She has never used smokeless tobacco. She reports that she drank alcohol. She reports that she does not use drugs.   Family History:  The  patient's family history includes COPD in her sister; CVA (age of onset: 6) in her mother; Heart attack (age of onset: 76) in her father; Heart attack (age of onset: 51) in her sister; Rheum arthritis in her sister; Sudden Cardiac Death (age of onset: 50) in her father.    ROS:  Please see the history of present illness. All other systems are reviewed and negative.    PHYSICAL EXAM: VS:  BP 132/80 (BP Location: Left Arm, Patient Position: Sitting)   Pulse 70   Ht 5' 2.5" (1.588 m)   Wt 162 lb 9.6 oz (73.8 kg)   BMI 29.27 kg/m  , BMI Body mass index is 29.27 kg/m. GEN: Well nourished, well developed, female in no acute distress  HEENT:  normal for age  Neck: no JVD, no carotid bruit, no masses Cardiac: RRR; soft murmur, no rubs, or gallops Respiratory:  clear to auscultation bilaterally, normal work of breathing GI: soft, nontender, nondistended, + BS MS: no deformity or atrophy; no edema; distal pulses are 2+ in all 4 extremities   Skin: warm and dry, no rash Neuro:  Strength and sensation are intact Psych: euthymic mood, full affect   EKG:  EKG is ordered today. The ekg ordered today demonstrates sinus rhythm, first-degree AV block with PR interval 234 ms, other intervals normal, no acute ischemic changes, Q waves in lead III only   Recent Labs: 02/13/2017: ALT 22 02/14/2017: BUN 14; Creatinine, Ser 0.78; Hemoglobin 11.0; Platelets 192; Potassium 3.4; Sodium 140    Lipid Panel from [facility] Cholesterol, total 281.000 10/15/2016 HDL 61.000 10/15/2016 LDL 187.000 10/15/2016 Triglycerides 162.000 10/15/2016    Wt Readings from Last 3 Encounters:  09/02/17 162 lb 9.6 oz (73.8 kg)  02/14/17 155 lb (70.3 kg)     Other studies Reviewed: Additional studies/ records that were reviewed today include: Office notes from Dr. Doyle Askew, epic records.  ASSESSMENT AND PLAN: The patient and plan were reviewed with Dr. Stanford Breed, who agrees  1.  Chest pain, moderate risk of cardiac etiology: -She has typical and atypical symptoms.  However, she has a strong family history of premature coronary artery disease in both her father and sister as well as significant hyperlipidemia and hypertension. - I feel some definitive testing is indicated. - Cardiac CT will give [place] information on her anatomy and a calcium score, will order, take metoprolol 1 hour prior and check a BMET today - Continue aspirin 81 mg once a day  2.  Hyperlipidemia: She has been intolerant of multiple statins with a rash. -Lipid profile done by her PCP almost a year ago as above - I will refer to the lipid clinic for consideration of PCSK9 inhibitors  3.   Hypertension: Blood pressures under good control today, no med changes as yet.   Current medicines are reviewed at length with the patient today.  The patient does not have concerns regarding medicines.  The following changes have been made: Continue aspirin, take beta-blocker prior to CT  Labs/ tests ordered today include:   Orders Placed This Encounter  Procedures  . CT CORONARY MORPH W/CTA COR W/SCORE W/CA W/CM &/OR WO/CM  . CT CORONARY FRACTIONAL FLOW RESERVE DATA PREP  . CT CORONARY FRACTIONAL FLOW RESERVE FLUID ANALYSIS  . Basic Metabolic Panel (BMET)  . EKG 12-Lead     Disposition:   FU with Dr. Stanford Breed  Signed, Rosaria Ferries, PA-C  09/02/2017 6:10 PM    [facility] Phone: [phone]; Fax: [phone]  This note was written with the assistance of speech recognition software. Please excuse any transcriptional errors.

## 2017-09-03 LAB — BASIC METABOLIC PANEL
BUN/Creatinine Ratio: 26 (ref 12–28)
BUN: 20 mg/dL (ref 8–27)
CALCIUM: 10.7 mg/dL — AB (ref 8.7–10.3)
CHLORIDE: 97 mmol/L (ref 96–106)
CO2: 27 mmol/L (ref 20–29)
Creatinine, Ser: 0.76 mg/dL (ref 0.57–1.00)
GFR calc Af Amer: 92 mL/min/{1.73_m2} (ref >59)
GFR, EST NON AFRICAN AMERICAN: 80 mL/min/{1.73_m2} (ref >59)
Glucose: 90 mg/dL (ref 65–99)
POTASSIUM: 3.7 mmol/L (ref 3.5–5.2)
SODIUM: 142 mmol/L (ref 134–144)

## 2017-09-09 ENCOUNTER — Telehealth: Payer: Self-pay | Admitting: Cardiology

## 2017-09-09 NOTE — Telephone Encounter (Signed)
Message forwarded to the ordering provider Rosaria Ferries, PA for recommendation on a different diagnosis code.

## 2017-09-09 NOTE — Telephone Encounter (Signed)
Tried to scheduled the cardiac ct - medicare will not accept the diagnosis code. I need to have a different diagnosis code to schedule. We are holding a spot for 10/03/17 for this patient .

## 2017-09-09 NOTE — Telephone Encounter (Signed)
Covering for Sharon Cook, reviewed her most recent note, would recommend change diagnosis code to Precordial chest pain.

## 2017-09-10 NOTE — Telephone Encounter (Signed)
Dx code was changed on the CT orders.

## 2017-09-17 DIAGNOSIS — R05 Cough: Secondary | ICD-10-CM | POA: Diagnosis not present

## 2017-09-17 DIAGNOSIS — K219 Gastro-esophageal reflux disease without esophagitis: Secondary | ICD-10-CM | POA: Diagnosis not present

## 2017-09-17 DIAGNOSIS — I10 Essential (primary) hypertension: Secondary | ICD-10-CM | POA: Diagnosis not present

## 2017-09-17 DIAGNOSIS — J0110 Acute frontal sinusitis, unspecified: Secondary | ICD-10-CM | POA: Diagnosis not present

## 2017-10-03 ENCOUNTER — Ambulatory Visit (HOSPITAL_COMMUNITY)
Admission: RE | Admit: 2017-10-03 | Discharge: 2017-10-03 | Disposition: A | Discharge status: Home / Self Care | Payer: Medicare Other | Source: Ambulatory Visit | Attending: Physician Assistant | Admitting: Physician Assistant

## 2017-10-03 ENCOUNTER — Ambulatory Visit (HOSPITAL_COMMUNITY): Payer: Medicare Other

## 2017-10-03 ENCOUNTER — Encounter (HOSPITAL_COMMUNITY): Payer: Self-pay

## 2017-10-03 DIAGNOSIS — R072 Precordial pain: Secondary | ICD-10-CM | POA: Insufficient documentation

## 2017-10-03 MED ORDER — NITROGLYCERIN 0.4 MG SL SUBL
SUBLINGUAL_TABLET | SUBLINGUAL | Status: AC
  Administered 2017-10-03: 0.8 mg via SUBLINGUAL
  Filled 2017-10-03: qty 2

## 2017-10-03 MED ORDER — METOPROLOL TARTRATE 5 MG/5ML IV SOLN
INTRAVENOUS | Status: AC
  Administered 2017-10-03: 5 mg via INTRAVENOUS
  Filled 2017-10-03: qty 20

## 2017-10-03 MED ORDER — IOPAMIDOL (ISOVUE-370) INJECTION 76%
100.0000 mL | Freq: Once | INTRAVENOUS | Status: AC | PRN
Start: 2017-10-03 — End: 2017-10-03
  Administered 2017-10-03: 80 mL via INTRAVENOUS

## 2017-10-03 MED ORDER — NITROGLYCERIN 0.4 MG SL SUBL
0.8000 mg | SUBLINGUAL_TABLET | Freq: Once | SUBLINGUAL | Status: AC
  Administered 2017-10-03: 0.8 mg via SUBLINGUAL
  Filled 2017-10-03: qty 25

## 2017-10-03 MED ORDER — METOPROLOL TARTRATE 5 MG/5ML IV SOLN
5.0000 mg | INTRAVENOUS | Status: AC | PRN
  Administered 2017-10-03 (×4): 5 mg via INTRAVENOUS

## 2017-10-06 NOTE — Progress Notes (Signed)
HPI: Follow-up chest pain.  Patient seen by Rosaria Ferries August 2019 with chest pain.  Cardiac CTA September 2019 showed calcium score 0 and no coronary disease.  Since last seen there is no dyspnea on exertion, orthopnea, PND, chest pain.  Current Outpatient Medications  Medication Sig Dispense Refill  . aspirin EC 81 MG tablet Take 81 mg by mouth daily.    . Calcium Carb-Cholecalciferol (CALCIUM-VITAMIN D) 500-200 MG-UNIT tablet Take 2 tablets by mouth daily.    . Coenzyme Q10 (COQ10) 200 MG CAPS Take 1 tablet by mouth daily.    . hydrochlorothiazide (HYDRODIURIL) 25 MG tablet Take 25 mg by mouth daily.    . magnesium oxide (MAG-OX) 400 (241.3 Mg) MG tablet Take 400 mg by mouth daily.    . metoprolol tartrate (LOPRESSOR) 50 MG tablet Take 1 tablet (50 mg total) by mouth once for 1 dose. TAKE ONE HOUR PRIOR TO PROCEDURE 1 tablet 0  . Multiple Vitamins-Minerals (HAIR/SKIN/NAILS/BIOTIN PO) Take 1 tablet by mouth daily.    . Omega-3 Fatty Acids (FISH OIL) 1000 MG CAPS Take 2 capsules by mouth daily.      No current facility-administered medications for this visit.      Past Medical History:  Diagnosis Date  . Acute appendicitis 02/14/2017  . Eosinophilic esophagitis   . Estrogen deficiency   . GERD without esophagitis   . Hypertension, essential 2013  . Post-operative nausea and vomiting    per patient   . Pure hypercholesterolemia    problems tolerating statins>>rash  . Rhinitis due to pollen   . Vitamin D deficiency     Past Surgical History:  Procedure Laterality Date  . ABDOMINAL HYSTERECTOMY    . CESAREAN SECTION     twice  . DILATION AND CURETTAGE OF UTERUS  08/1998  . ESOPHAGUS STRETCHED  03/2010  . LAPAROSCOPIC APPENDECTOMY N/A 02/14/2017   Procedure: APPENDECTOMY LAPAROSCOPIC;  Surgeon: Greer Pickerel, MD;  Location: [facility] OR;  Service: General;  Laterality: N/A;    Social History   Socioeconomic History  . Marital status: Married    Spouse name: ROY  Fletes  . Number of children: Not on file  . Years of education: Not on file  . Highest education level: Not on file  Occupational History  . Occupation: Camera operator, [place]  . Financial resource strain: Not on file  . Food insecurity:    Worry: Not on file    Inability: Not on file  . Transportation needs:    Medical: Not on file    Non-medical: Not on file  Tobacco Use  . Smoking status: Never Smoker  . Smokeless tobacco: Never Used  Substance and Sexual Activity  . Alcohol use: Not Currently  . Drug use: Never  . Sexual activity: Not on file    Comment: HYSTERECTOMY  Lifestyle  . Physical activity:    Days per week: Not on file    Minutes per session: Not on file  . Stress: Not on file  Relationships  . Social connections:    Talks on phone: Not on file    Gets together: Not on file    Attends religious service: Not on file    Active member of club or organization: Not on file    Attends meetings of clubs or organizations: Not on file    Relationship status: Not on file  . Intimate partner violence:    Fear of current or ex  partner: Not on file    Emotionally abused: Not on file    Physically abused: Not on file    Forced sexual activity: Not on file  Other Topics Concern  . Not on file  Social History Narrative   Pt lives w/ husband at [facility] in [place].     Family History  Problem Relation Age of Onset  . CVA Mother 60       cerebral hemmorhage  . Heart attack Father 50  . Sudden Cardiac Death Father 4       no autopsy  . Heart attack Sister 56  . COPD Sister   . Rheum arthritis Sister     ROS: no fevers or chills, productive cough, hemoptysis, dysphasia, odynophagia, melena, hematochezia, dysuria, hematuria, rash, seizure activity, orthopnea, PND, pedal edema, claudication. Remaining systems are negative.  Physical Exam: Well-developed well-nourished in no acute distress.  Skin is warm and dry.  HEENT  is normal.  Neck is supple.  Chest is clear to auscultation with normal expansion.  Cardiovascular exam is regular rate and rhythm.  Abdominal exam nontender or distended. No masses palpated. Extremities show no edema. neuro grossly intact   A/P  1 chest pain-previous symptoms are atypical.  Her CTA shows calcium score of 0 and no coronary disease.  We will not pursue further ischemia evaluation.  No recurrent symptoms.  2 hypertension-blood pressure is elevated.  Add Cozaar 50 mg daily.  Decrease Hydrocort thiazide to 12.5 mg daily.  Check potassium and renal function in 1 week.  3 hyperlipidemia-patient is intolerant to statins.  She is scheduled to have blood work done in approximately 1 month with her primary care physician.  We will await lipids.  If LDL significantly elevated we will refer to lipid clinic for consideration of other medications such as Repatha.  Kirk Ruths, MD

## 2017-10-07 ENCOUNTER — Ambulatory Visit (INDEPENDENT_AMBULATORY_CARE_PROVIDER_SITE_OTHER): Payer: Medicare Other | Admitting: Cardiology

## 2017-10-07 ENCOUNTER — Encounter: Payer: Self-pay | Admitting: Cardiology

## 2017-10-07 VITALS — BP 153/80 | HR 75 | Ht 62.0 in | Wt 162.4 lb

## 2017-10-07 DIAGNOSIS — E7800 Pure hypercholesterolemia, unspecified: Secondary | ICD-10-CM

## 2017-10-07 DIAGNOSIS — R072 Precordial pain: Secondary | ICD-10-CM | POA: Diagnosis not present

## 2017-10-07 DIAGNOSIS — I10 Essential (primary) hypertension: Secondary | ICD-10-CM | POA: Diagnosis not present

## 2017-10-07 DIAGNOSIS — Z79899 Other long term (current) drug therapy: Secondary | ICD-10-CM | POA: Diagnosis not present

## 2017-10-07 MED ORDER — LOSARTAN POTASSIUM 50 MG PO TABS: 50.0000 mg | ORAL_TABLET | Freq: Every day | ORAL | 3 refills | Status: DC

## 2017-10-07 MED ORDER — HYDROCHLOROTHIAZIDE 12.5 MG PO TABS: 25.0000 mg | ORAL_TABLET | Freq: Every day | ORAL | 3 refills | Status: DC

## 2017-10-07 NOTE — Patient Instructions (Addendum)
Medication Instructions: Your Physician recommend you make the following changes to your medication. Decrease: Hydrochlorothiazide 12.5 mg Start: Cozaar 50 mg daily   If you need a refill on your cardiac medications before your next appointment, please call your pharmacy.   Labwork: Your physician recommends that you return for lab work in 1 week BMP   Procedures/Testing: None   Follow-Up: Your physician wants you to follow-up with Dr. Stanford Breed in 6 months  Special Instructions:     Thank you for choosing Heartcare at [facility]!!

## 2017-11-18 DIAGNOSIS — I10 Essential (primary) hypertension: Secondary | ICD-10-CM | POA: Diagnosis not present

## 2017-11-18 DIAGNOSIS — E2839 Other primary ovarian failure: Secondary | ICD-10-CM | POA: Diagnosis not present

## 2017-11-18 DIAGNOSIS — Z1231 Encounter for screening mammogram for malignant neoplasm of breast: Secondary | ICD-10-CM | POA: Diagnosis not present

## 2017-11-18 DIAGNOSIS — Z0000 Encounter for general adult medical examination without abnormal findings: Secondary | ICD-10-CM | POA: Diagnosis not present

## 2017-11-18 DIAGNOSIS — Z1211 Encounter for screening for malignant neoplasm of colon: Secondary | ICD-10-CM | POA: Diagnosis not present

## 2017-11-18 DIAGNOSIS — Z23 Encounter for immunization: Secondary | ICD-10-CM | POA: Diagnosis not present

## 2017-11-18 DIAGNOSIS — E559 Vitamin D deficiency, unspecified: Secondary | ICD-10-CM | POA: Diagnosis not present

## 2017-11-18 DIAGNOSIS — K219 Gastro-esophageal reflux disease without esophagitis: Secondary | ICD-10-CM | POA: Diagnosis not present

## 2017-11-18 DIAGNOSIS — K200 Eosinophilic esophagitis: Secondary | ICD-10-CM | POA: Diagnosis not present

## 2017-11-18 DIAGNOSIS — E7800 Pure hypercholesterolemia, unspecified: Secondary | ICD-10-CM | POA: Diagnosis not present

## 2017-11-18 DIAGNOSIS — M5442 Lumbago with sciatica, left side: Secondary | ICD-10-CM | POA: Diagnosis not present

## 2017-12-01 DIAGNOSIS — M545 Low back pain: Secondary | ICD-10-CM | POA: Diagnosis not present

## 2017-12-10 DIAGNOSIS — M5416 Radiculopathy, lumbar region: Secondary | ICD-10-CM | POA: Diagnosis not present

## 2017-12-17 DIAGNOSIS — M5136 Other intervertebral disc degeneration, lumbar region: Secondary | ICD-10-CM | POA: Diagnosis not present

## 2017-12-17 DIAGNOSIS — M419 Scoliosis, unspecified: Secondary | ICD-10-CM | POA: Diagnosis not present

## 2017-12-17 DIAGNOSIS — M545 Low back pain: Secondary | ICD-10-CM | POA: Diagnosis not present

## 2017-12-29 DIAGNOSIS — M5416 Radiculopathy, lumbar region: Secondary | ICD-10-CM | POA: Diagnosis not present

## 2018-02-10 DIAGNOSIS — Z1231 Encounter for screening mammogram for malignant neoplasm of breast: Secondary | ICD-10-CM | POA: Diagnosis not present

## 2018-05-20 ENCOUNTER — Telehealth: Payer: Self-pay | Admitting: *Deleted

## 2018-05-20 NOTE — Telephone Encounter (Signed)
05/20/18 05/20/18 LMOM @ 2:13 pm, re: follow up appointment.

## 2018-07-20 DIAGNOSIS — I10 Essential (primary) hypertension: Secondary | ICD-10-CM | POA: Diagnosis not present

## 2018-07-20 DIAGNOSIS — E7800 Pure hypercholesterolemia, unspecified: Secondary | ICD-10-CM | POA: Diagnosis not present

## 2018-09-28 ENCOUNTER — Ambulatory Visit: Payer: Medicare Other | Admitting: Cardiology

## 2018-10-03 ENCOUNTER — Emergency Department (HOSPITAL_BASED_OUTPATIENT_CLINIC_OR_DEPARTMENT_OTHER)
Admission: EM | Admit: 2018-10-03 | Discharge: 2018-10-03 | Disposition: A | Discharge status: Home / Self Care | Payer: Medicare Other | Attending: Emergency Medicine | Admitting: Emergency Medicine

## 2018-10-03 ENCOUNTER — Encounter (HOSPITAL_BASED_OUTPATIENT_CLINIC_OR_DEPARTMENT_OTHER): Payer: Self-pay | Admitting: Emergency Medicine

## 2018-10-03 ENCOUNTER — Other Ambulatory Visit: Payer: Self-pay

## 2018-10-03 DIAGNOSIS — Z79899 Other long term (current) drug therapy: Secondary | ICD-10-CM | POA: Insufficient documentation

## 2018-10-03 DIAGNOSIS — Z7982 Long term (current) use of aspirin: Secondary | ICD-10-CM | POA: Diagnosis not present

## 2018-10-03 DIAGNOSIS — M62838 Other muscle spasm: Secondary | ICD-10-CM

## 2018-10-03 DIAGNOSIS — I10 Essential (primary) hypertension: Secondary | ICD-10-CM | POA: Insufficient documentation

## 2018-10-03 DIAGNOSIS — M542 Cervicalgia: Secondary | ICD-10-CM | POA: Diagnosis present

## 2018-10-03 MED ORDER — LIDOCAINE 5 % EX PTCH: 1.0000 | MEDICATED_PATCH | CUTANEOUS | 0 refills | Status: DC

## 2018-10-03 MED ORDER — KETOROLAC TROMETHAMINE 60 MG/2ML IM SOLN
30.0000 mg | Freq: Once | INTRAMUSCULAR | Status: AC
  Administered 2018-10-03: 30 mg via INTRAMUSCULAR
  Filled 2018-10-03: qty 2

## 2018-10-03 MED ORDER — ACETAMINOPHEN 500 MG PO TABS
1000.0000 mg | ORAL_TABLET | Freq: Once | ORAL | Status: AC
  Administered 2018-10-03: 07:00:00 1000 mg via ORAL
  Filled 2018-10-03: qty 2

## 2018-10-03 MED ORDER — METHOCARBAMOL 500 MG PO TABS
1000.0000 mg | ORAL_TABLET | Freq: Once | ORAL | Status: AC
  Administered 2018-10-03: 1000 mg via ORAL
  Filled 2018-10-03: qty 2

## 2018-10-03 MED ORDER — DICLOFENAC SODIUM ER 100 MG PO TB24: 100.0000 mg | ORAL_TABLET | Freq: Every day | ORAL | 0 refills | Status: DC

## 2018-10-03 MED ORDER — METHOCARBAMOL 500 MG PO TABS
ORAL_TABLET | ORAL | Status: AC
  Filled 2018-10-03: qty 1

## 2018-10-03 MED ORDER — METHOCARBAMOL 500 MG PO TABS: 500.0000 mg | ORAL_TABLET | Freq: Two times a day (BID) | ORAL | 0 refills | Status: DC

## 2018-10-03 NOTE — ED Notes (Signed)
Given heat pack for neck

## 2018-10-03 NOTE — ED Triage Notes (Addendum)
Having neck pain since Thursday, took a Pilate's class on Tuesday and thinks  this may be the cause . Pain with movement

## 2018-10-03 NOTE — ED Notes (Signed)
ED Provider at bedside. 

## 2018-10-03 NOTE — ED Provider Notes (Addendum)
[facility] EMERGENCY DEPARTMENT Provider Note   CSN: [id number] Arrival date & time: 10/03/18  [id number]     History   Chief Complaint Chief Complaint  Patient presents with  . Torticollis    HPI Sharon Cook is a 73 y.o. female.     The history is provided by the patient.  Illness Location:  Neck R>L Quality:  Stiffness and spasm Severity:  Moderate Onset quality:  Gradual Duration:  4 days Timing:  Constant Progression:  Unchanged Chronicity:  New Context:  Post new pilates routine Relieved by:  Nothing Worsened by:  Movement side to side Ineffective treatments:  Naproxen Associated symptoms: no abdominal pain, no chest pain, no congestion, no cough, no diarrhea, no ear pain, no fatigue, no fever, no headaches, no loss of consciousness, no myalgias, no nausea, no rash, no rhinorrhea, no shortness of breath, no sore throat, no vomiting and no wheezing   Risk factors:  Elderly Started new pilates regimen now neck stiffness and pain.  No f/c/r.  No rashes.  No weakness no numbness no changes in vision or speech.  No CP no SOB.    Past Medical History:  Diagnosis Date  . Acute appendicitis 02/14/2017  . 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 Eosinophilic esophagitis   . Estrogen deficiency   . GERD without esophagitis   . Hypertension, essential 2013  . Post-operative nausea and vomiting    per patient   . Pure hypercholesterolemia    problems tolerating statins>>rash  . Rhinitis due to pollen   . Superficial basal cell carcinoma (BCC) 04/15/1997   Right Arm  . Vitamin D deficiency     Patient Active Problem List   Diagnosis Date Noted  . Pure hypercholesterolemia 08/19/2017  . Estrogen deficiency 08/19/2017  . Vitamin D deficiency 08/19/2017  . Essential hypertension 08/19/2017  . Eosinophilic esophagitis [id number]  . Acute appendicitis 02/14/2017  . FIBROIDS, UTERUS 03/02/2008  . HYPERLIPIDEMIA 03/02/2008  . EOSINOPHILIA  03/02/2008  . ALLERGIC RHINITIS 03/02/2008  . G E R D 03/02/2008  . COUGH 03/02/2008    Past Surgical History:  Procedure Laterality Date  . ABDOMINAL HYSTERECTOMY    . CESAREAN SECTION     twice  . DILATION AND CURETTAGE OF UTERUS  08/1998  . ESOPHAGUS STRETCHED  03/2010  . LAPAROSCOPIC APPENDECTOMY N/A 02/14/2017   Procedure: APPENDECTOMY LAPAROSCOPIC;  Surgeon: Greer Pickerel, MD;  Location: [facility];  Service: General;  Laterality: N/A;     OB History   No obstetric history on file.      Home Medications    Prior to Admission medications   Medication Sig Start Date End Date Taking? Authorizing Provider  aspirin EC 81 MG tablet Take 81 mg by mouth daily.    [provider]  Calcium Carb-Cholecalciferol (CALCIUM-VITAMIN D) 500-200 MG-UNIT tablet Take 2 tablets by mouth daily.    [provider]  Coenzyme Q10 (COQ10) 200 MG CAPS Take 1 tablet by mouth daily.    [provider]  Diclofenac Sodium CR 100 MG 24 hr tablet Take 1 tablet (100 mg total) by mouth daily. 10/03/18   Sosha Shepherd, MD  ezetimibe (ZETIA) 10 MG tablet  09/30/18   [provider]  hydrochlorothiazide (HYDRODIURIL) 12.5 MG tablet Take 2 tablets (25 mg total) by mouth daily. 10/07/17   Lelon Perla, MD  lidocaine (LIDODERM) 5 % Place 1 patch onto the skin daily. Remove & Discard patch within 12 hours  or as directed by MD 10/03/18   Randal Buba, Cadence Minton, MD  losartan (COZAAR) 50 MG tablet Take 1 tablet (50 mg total) by mouth daily. 10/07/17 01/05/18  Lelon Perla, MD  magnesium oxide (MAG-OX) 400 (241.3 Mg) MG tablet Take 400 mg by mouth daily.    [provider]  methocarbamol (ROBAXIN) 500 MG tablet Take 1 tablet (500 mg total) by mouth 2 (two) times daily. 10/03/18   Skyelynn Rambeau, MD  metoprolol tartrate (LOPRESSOR) 50 MG tablet Take 1 tablet (50 mg total) by mouth once for 1 dose. TAKE ONE HOUR PRIOR TO PROCEDURE 09/02/17 09/02/17  Barrett, Evelene Croon, PA-C  Multiple  Vitamins-Minerals (HAIR/SKIN/NAILS/BIOTIN PO) Take 1 tablet by mouth daily.    [provider]  Omega-3 Fatty Acids (FISH OIL) 1000 MG CAPS Take 2 capsules by mouth daily.     [provider]    Family History Family History  Problem Relation Age of Onset  . CVA Mother 60       cerebral hemmorhage  . Heart attack Father 27  . Sudden Cardiac Death Father 5       no autopsy  . Heart attack Sister 39  . COPD Sister   . Rheum arthritis Sister   . Melanoma Niece     Social History Social History   Tobacco Use  . Smoking status: Never Smoker  . Smokeless tobacco: Never Used  Substance Use Topics  . Alcohol use: Not Currently  . Drug use: Never     Allergies   Codeine, Simvastatin, and Atorvastatin   Review of Systems Review of Systems  Constitutional: Negative for fatigue and fever.  HENT: Negative for congestion, ear pain, rhinorrhea and sore throat.   Eyes: Negative for visual disturbance.  Respiratory: Negative for cough, shortness of breath and wheezing.   Cardiovascular: Negative for chest pain.  Gastrointestinal: Negative for abdominal pain, diarrhea, nausea and vomiting.  Genitourinary: Negative for difficulty urinating.  Musculoskeletal: Positive for neck pain and neck stiffness. Negative for arthralgias, back pain, gait problem, joint swelling and myalgias.  Skin: Negative for rash.  Neurological: Negative for dizziness, loss of consciousness, facial asymmetry, speech difficulty, weakness, light-headedness, numbness and headaches.  Psychiatric/Behavioral: Negative for agitation.     Physical Exam Updated Vital Signs BP (!) 144/69 (BP Location: Right Arm)   Pulse 74   Temp 98.1 F (36.7 C) (Oral)   Resp 18   Ht 5\' 2"  (1.575 m)   Wt 70.3 kg   SpO2 98%   BMI 28.35 kg/m   Physical Exam Vitals signs and nursing note reviewed.  Constitutional:      General: She is not in acute distress.    Appearance: She is normal weight. She is not  toxic-appearing.  HENT:     Head: Normocephalic and atraumatic.     Nose: Nose normal.     Mouth/Throat:     Mouth: Mucous membranes are moist.     Pharynx: Oropharynx is clear.  Eyes:     Conjunctiva/sclera: Conjunctivae normal.     Pupils: Pupils are equal, round, and reactive to light.  Neck:     Musculoskeletal: No neck rigidity or spinous process tenderness.     Thyroid: No thyroid mass.     Vascular: Normal carotid pulses. No carotid bruit, hepatojugular reflux or JVD.     Trachea: Trachea and phonation normal.   Cardiovascular:     Rate and Rhythm: Normal rate and regular rhythm.     Pulses: Normal pulses.  Heart sounds: Normal heart sounds.  Pulmonary:     Effort: Pulmonary effort is normal.     Breath sounds: Normal breath sounds.  Abdominal:     General: Abdomen is flat. Bowel sounds are normal.     Tenderness: There is no abdominal tenderness. There is no guarding or rebound.  Musculoskeletal: Normal range of motion.     Cervical back: Normal.  Lymphadenopathy:     Cervical: No cervical adenopathy.  Skin:    General: Skin is warm and dry.     Capillary Refill: Capillary refill takes less than 2 seconds.  Neurological:     General: No focal deficit present.     Mental Status: She is alert and oriented to person, place, and time.     Cranial Nerves: No cranial nerve deficit.     Sensory: No sensory deficit.     Motor: No weakness.     Coordination: Coordination normal.     Deep Tendon Reflexes: Reflexes normal.  Psychiatric:        Mood and Affect: Mood normal.        Behavior: Behavior normal.      ED Treatments / Results  Labs (all labs ordered are listed, but only abnormal results are displayed) Labs Reviewed - No data to display  EKG None  Radiology No results found.  Procedures Procedures (including critical care time)  Medications Ordered in ED Medications  ketorolac (TORADOL) injection 30 mg (has no administration in time range)   methocarbamol (ROBAXIN) tablet 1,000 mg (has no administration in time range)  acetaminophen (TYLENOL) tablet 1,000 mg (has no administration in time range)     Pain with movement, has spasm on exam.  No midline tenderness. No neurologic symptoms or exam findings.  I have done a full neurologic exam on this pain and there is no weakness nor numbness and cranial nerves are normal.  There was no headache so there is no indication for CT at this time. I considered infection but exam history and vitals are not consistent with this,   Pain is clearly MSK in nature.  Will treat symptomatically and have patient follow up with sports medicine.  I had a long discussion that despite medication these symptoms will often last 7-10 days given that they have been present for several days and to continue heat therapy.  Patient is improved post medication and PO challenged successfully.    Final Clinical Impressions(s) / ED Diagnoses   Final diagnoses:  Muscle spasm   Return for intractable cough, coughing up blood,fevers >100.4 unrelieved by medication, shortness of breath, intractable vomiting, chest pain, shortness of breath, weakness,numbness, changes in speech, facial asymmetry,abdominal pain, passing out,Inability to tolerate liquids or food, cough, altered mental status or any concerns. No signs of systemic illness or infection. The patient is nontoxic-appearing on exam and vital signs are within normal limits.   I have reviewed the triage vital signs and the nursing notes. Pertinent labs &imaging results that were available during my care of the patient were reviewed by me and considered in my medical decision making (see chart for details).  After history, exam, and medical workup I feel the patient has been appropriately medically screened and is safe for discharge home. Pertinent diagnoses were discussed with the patient. Patient was given return precautions ED Discharge Orders          Ordered    Diclofenac Sodium CR 100 MG 24 hr tablet  Daily     10/03/18 0644  lidocaine (LIDODERM) 5 %  Every 24 hours     10/03/18 0644    methocarbamol (ROBAXIN) 500 MG tablet  2 times daily     10/03/18 0644           Captola Teschner, MD 10/03/18 [place], Delrose Rohwer, MD 10/03/18 [id number]

## 2018-11-08 ENCOUNTER — Other Ambulatory Visit: Payer: Self-pay | Admitting: Cardiology

## 2018-12-02 DIAGNOSIS — Z23 Encounter for immunization: Secondary | ICD-10-CM | POA: Diagnosis not present

## 2018-12-03 DIAGNOSIS — M542 Cervicalgia: Secondary | ICD-10-CM | POA: Diagnosis not present

## 2018-12-08 DIAGNOSIS — H2513 Age-related nuclear cataract, bilateral: Secondary | ICD-10-CM | POA: Diagnosis not present

## 2018-12-08 DIAGNOSIS — H524 Presbyopia: Secondary | ICD-10-CM | POA: Diagnosis not present

## 2018-12-08 DIAGNOSIS — H5203 Hypermetropia, bilateral: Secondary | ICD-10-CM | POA: Diagnosis not present

## 2018-12-21 NOTE — Progress Notes (Signed)
HPI: Follow-up chest pain.  Patient seen by Sharon Cook August 2019 with chest pain.  Cardiac CTA September 2019 showed calcium score 0 and no coronary disease.  Since last seen the patient denies any dyspnea on exertion, orthopnea, PND, pedal edema, palpitations, syncope or chest pain.   Current Outpatient Medications  Medication Sig Dispense Refill  . aspirin EC 81 MG tablet Take 81 mg by mouth daily.    . Calcium Carb-Cholecalciferol (CALCIUM-VITAMIN D) 500-200 MG-UNIT tablet Take 2 tablets by mouth daily.    . Coenzyme Q10 (COQ10) 200 MG CAPS Take 1 tablet by mouth daily.    Marland Kitchen ezetimibe (ZETIA) 10 MG tablet     . hydrochlorothiazide (HYDRODIURIL) 12.5 MG tablet Take 2 tablets (25 mg total) by mouth daily. 90 tablet 3  . Multiple Vitamins-Minerals (HAIR/SKIN/NAILS/BIOTIN PO) Take 1 tablet by mouth daily.    . Omega-3 Fatty Acids (FISH OIL) 1000 MG CAPS Take 2 capsules by mouth daily.     . Diclofenac Sodium CR 100 MG 24 hr tablet Take 1 tablet (100 mg total) by mouth daily. (Patient not taking: Reported on 12/29/2018) 7 tablet 0  . lidocaine (LIDODERM) 5 % Place 1 patch onto the skin daily. Remove & Discard patch within 12 hours or as directed by MD (Patient not taking: Reported on 12/29/2018) 10 patch 0  . losartan (COZAAR) 50 MG tablet Take 1 tablet (50 mg total) by mouth daily. (Patient not taking: Reported on 12/29/2018) 90 tablet 3  . magnesium oxide (MAG-OX) 400 (241.3 Mg) MG tablet Take 400 mg by mouth daily.    . methocarbamol (ROBAXIN) 500 MG tablet Take 1 tablet (500 mg total) by mouth 2 (two) times daily. (Patient not taking: Reported on 12/29/2018) 14 tablet 0  . metoprolol tartrate (LOPRESSOR) 50 MG tablet Take 1 tablet (50 mg total) by mouth once for 1 dose. TAKE ONE HOUR PRIOR TO PROCEDURE (Patient not taking: Reported on 12/29/2018) 1 tablet 0   No current facility-administered medications for this visit.      Past Medical History:  Diagnosis Date  . Acute  appendicitis 02/14/2017  . 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 Eosinophilic esophagitis   . Estrogen deficiency   . GERD without esophagitis   . Hypertension, essential 2013  . Post-operative nausea and vomiting    per patient   . Pure hypercholesterolemia    problems tolerating statins>>rash  . Rhinitis due to pollen   . Superficial basal cell carcinoma (BCC) 04/15/1997   Right Arm  . Vitamin D deficiency     Past Surgical History:  Procedure Laterality Date  . ABDOMINAL HYSTERECTOMY    . CESAREAN SECTION     twice  . DILATION AND CURETTAGE OF UTERUS  08/1998  . ESOPHAGUS STRETCHED  03/2010  . LAPAROSCOPIC APPENDECTOMY N/A 02/14/2017   Procedure: APPENDECTOMY LAPAROSCOPIC;  Surgeon: Greer Pickerel, MD;  Location: [facility] OR;  Service: General;  Laterality: N/A;    Social History   Socioeconomic History  . Marital status: Married    Spouse name: ROY Steller  . Number of children: Not on file  . Years of education: Not on file  . Highest education level: Not on file  Occupational History  . Occupation: Camera operator, [employer]  . Financial resource strain: Not on file  . Food insecurity    Worry: Not on file    Inability: Not on file  . Transportation  needs    Medical: Not on file    Non-medical: Not on file  Tobacco Use  . Smoking status: Never Smoker  . Smokeless tobacco: Never Used  Substance and Sexual Activity  . Alcohol use: Not Currently  . Drug use: Never  . Sexual activity: Not on file    Comment: HYSTERECTOMY  Lifestyle  . Physical activity    Days per week: Not on file    Minutes per session: Not on file  . Stress: Not on file  Relationships  . Social Herbalist on phone: Not on file    Gets together: Not on file    Attends religious service: Not on file    Active member of club or organization: Not on file    Attends meetings of clubs or organizations: Not on file    Relationship status:  Not on file  . Intimate partner violence    Fear of current or ex partner: Not on file    Emotionally abused: Not on file    Physically abused: Not on file    Forced sexual activity: Not on file  Other Topics Concern  . Not on file  Social History Narrative   Pt lives w/ husband at [facility] in [place].     Family History  Problem Relation Age of Onset  . CVA Mother 69       cerebral hemmorhage  . Heart attack Father 64  . Sudden Cardiac Death Father 69       no autopsy  . Heart attack Sister 57  . COPD Sister   . Rheum arthritis Sister   . Melanoma Niece     ROS: no fevers or chills, productive cough, hemoptysis, dysphasia, odynophagia, melena, hematochezia, dysuria, hematuria, rash, seizure activity, orthopnea, PND, pedal edema, claudication. Remaining systems are negative.  Physical Exam: Well-developed well-nourished in no acute distress.  Skin is warm and dry.  HEENT is normal.  Neck is supple.  Chest is clear to auscultation with normal expansion.  Cardiovascular exam is regular rate and rhythm.  Abdominal exam nontender or distended. No masses palpated. Extremities show no edema. neuro grossly intact  ECG-sinus rhythm at a rate of 97, first-degree AV block, right atrial enlargement, no ST changes.  Personally reviewed  A/P  1 chest pain-she has not had further symptoms.  Previous CTA showed calcium score of 0 and no coronary disease.  2 hypertension-blood pressure is elevated; she will follow and we will adjust meds as needed.  3 hyperlipidemia-intolerant to statins.  Kirk Ruths, MD

## 2018-12-29 ENCOUNTER — Ambulatory Visit (INDEPENDENT_AMBULATORY_CARE_PROVIDER_SITE_OTHER): Payer: Medicare Other | Admitting: Cardiology

## 2018-12-29 ENCOUNTER — Other Ambulatory Visit: Payer: Self-pay

## 2018-12-29 ENCOUNTER — Encounter: Payer: Self-pay | Admitting: Cardiology

## 2018-12-29 VITALS — BP 152/86 | HR 94 | Temp 97.9°F | Ht 62.5 in | Wt 160.0 lb

## 2018-12-29 DIAGNOSIS — I10 Essential (primary) hypertension: Secondary | ICD-10-CM | POA: Diagnosis not present

## 2018-12-29 DIAGNOSIS — E7800 Pure hypercholesterolemia, unspecified: Secondary | ICD-10-CM | POA: Diagnosis not present

## 2018-12-29 NOTE — Patient Instructions (Signed)

## 2019-01-07 DIAGNOSIS — M542 Cervicalgia: Secondary | ICD-10-CM | POA: Diagnosis not present

## 2019-02-09 DIAGNOSIS — M542 Cervicalgia: Secondary | ICD-10-CM | POA: Diagnosis not present

## 2019-03-04 DIAGNOSIS — Z23 Encounter for immunization: Secondary | ICD-10-CM | POA: Diagnosis not present

## 2019-04-02 ENCOUNTER — Other Ambulatory Visit: Payer: Self-pay

## 2019-04-02 DIAGNOSIS — Z23 Encounter for immunization: Secondary | ICD-10-CM | POA: Diagnosis not present

## 2019-04-02 MED ORDER — HYDROCHLOROTHIAZIDE 12.5 MG PO TABS: 25.0000 mg | ORAL_TABLET | Freq: Every day | ORAL | 3 refills | Status: DC

## 2019-04-22 DIAGNOSIS — M519 Unspecified thoracic, thoracolumbar and lumbosacral intervertebral disc disorder: Secondary | ICD-10-CM | POA: Diagnosis not present

## 2019-04-22 DIAGNOSIS — M542 Cervicalgia: Secondary | ICD-10-CM | POA: Diagnosis not present

## 2019-04-28 DIAGNOSIS — M542 Cervicalgia: Secondary | ICD-10-CM | POA: Diagnosis not present

## 2019-05-07 DIAGNOSIS — M542 Cervicalgia: Secondary | ICD-10-CM | POA: Diagnosis not present

## 2019-05-11 DIAGNOSIS — M542 Cervicalgia: Secondary | ICD-10-CM | POA: Diagnosis not present

## 2019-05-13 ENCOUNTER — Ambulatory Visit (INDEPENDENT_AMBULATORY_CARE_PROVIDER_SITE_OTHER): Payer: Medicare Other | Admitting: Physician Assistant

## 2019-05-13 ENCOUNTER — Encounter: Payer: Self-pay | Admitting: Physician Assistant

## 2019-05-13 ENCOUNTER — Other Ambulatory Visit: Payer: Self-pay

## 2019-05-13 DIAGNOSIS — L820 Inflamed seborrheic keratosis: Secondary | ICD-10-CM | POA: Diagnosis not present

## 2019-05-13 DIAGNOSIS — M5030 Other cervical disc degeneration, unspecified cervical region: Secondary | ICD-10-CM | POA: Diagnosis not present

## 2019-05-13 DIAGNOSIS — R21 Rash and other nonspecific skin eruption: Secondary | ICD-10-CM | POA: Diagnosis not present

## 2019-05-13 DIAGNOSIS — M542 Cervicalgia: Secondary | ICD-10-CM | POA: Diagnosis not present

## 2019-05-13 DIAGNOSIS — Z1283 Encounter for screening for malignant neoplasm of skin: Secondary | ICD-10-CM | POA: Diagnosis not present

## 2019-05-13 MED ORDER — TRIAMCINOLONE ACETONIDE 0.1 % EX CREA: 1.0000 "application " | TOPICAL_CREAM | Freq: Two times a day (BID) | CUTANEOUS | 0 refills | Status: AC

## 2019-05-18 DIAGNOSIS — M542 Cervicalgia: Secondary | ICD-10-CM | POA: Diagnosis not present

## 2019-05-20 DIAGNOSIS — M542 Cervicalgia: Secondary | ICD-10-CM | POA: Diagnosis not present

## 2019-05-27 DIAGNOSIS — M542 Cervicalgia: Secondary | ICD-10-CM | POA: Diagnosis not present

## 2019-06-01 DIAGNOSIS — M542 Cervicalgia: Secondary | ICD-10-CM | POA: Diagnosis not present

## 2019-06-07 NOTE — Progress Notes (Addendum)
   Follow-Up Visit   Subjective  Sharon Cook is a 74 y.o. female who presents for the following: Annual Exam (right temple-+ itch for 2 months). No problems. No history of NMSC or DN.   The following portions of the chart were reviewed this encounter and updated as appropriate: Tobacco  Allergies  Meds  Problems  Med Hx  Surg Hx  Fam Hx      Objective  Well appearing patient in no apparent distress; mood and affect are within normal limits.  A full examination was performed including scalp, head, eyes, ears, nose, lips, neck, chest, axillae, abdomen, back, buttocks, bilateral upper extremities, bilateral lower extremities, hands, feet, fingers, toes, fingernails, and toenails. All findings within normal limits unless otherwise noted below.  Objective  Mid Back: Thin scaly erythematous papules coalescing to plaques.   Objective  head to toe: No Dn  Objective  Left Inguinal Area, Left Medial Thigh, Left Shoulder - Posterior, Left Thigh - Anterior, Mid Back, Neck - Anterior, Right Breast, Right Frontal Scalp, Right Inguinal Area (2), Right Upper Back: Erythematous stuck-on, waxy papule or plaque.   Assessment & Plan  Rash and other nonspecific skin eruption Mid Back  triamcinolone cream (KENALOG) 0.1 % - Mid Back  Screening exam for skin cancer head to toe  observe  Seborrheic keratoses, inflamed (11) Left Shoulder - Posterior; Neck - Anterior; Left Thigh - Anterior; Right Breast; Left Medial Thigh; Left Inguinal Area; Right Inguinal Area (2); Right Upper Back; Mid Back; Right Frontal Scalp  Destruction of lesion - Left Inguinal Area, Left Medial Thigh, Left Shoulder - Posterior, Left Thigh - Anterior, Mid Back, Neck - Anterior, Right Breast, Right Frontal Scalp, Right Inguinal Area (2), Right Upper Back Complexity: simple   Destruction method: cryotherapy   Informed consent: discussed and consent obtained   Timeout:  patient name, date of birth, surgical site,  and procedure verified Lesion destroyed using liquid nitrogen: Yes   Cryotherapy cycles:  1 Outcome: patient tolerated procedure well with no complications   Post-procedure details: wound care instructions given

## 2019-06-08 DIAGNOSIS — M542 Cervicalgia: Secondary | ICD-10-CM | POA: Diagnosis not present

## 2019-06-16 DIAGNOSIS — M542 Cervicalgia: Secondary | ICD-10-CM | POA: Diagnosis not present

## 2019-06-17 DIAGNOSIS — M545 Low back pain: Secondary | ICD-10-CM | POA: Diagnosis not present

## 2019-06-17 DIAGNOSIS — M5431 Sciatica, right side: Secondary | ICD-10-CM | POA: Diagnosis not present

## 2019-06-17 DIAGNOSIS — M5416 Radiculopathy, lumbar region: Secondary | ICD-10-CM | POA: Diagnosis not present

## 2019-06-17 NOTE — Addendum Note (Signed)
Addended by: Warren Danes on: 06/17/2019 01:48 PM   Modules accepted: Orders

## 2019-06-29 DIAGNOSIS — M545 Low back pain: Secondary | ICD-10-CM | POA: Diagnosis not present

## 2019-06-29 DIAGNOSIS — M5136 Other intervertebral disc degeneration, lumbar region: Secondary | ICD-10-CM | POA: Diagnosis not present

## 2019-06-30 DIAGNOSIS — M5416 Radiculopathy, lumbar region: Secondary | ICD-10-CM | POA: Diagnosis not present

## 2019-07-07 DIAGNOSIS — M545 Low back pain: Secondary | ICD-10-CM | POA: Diagnosis not present

## 2019-07-09 DIAGNOSIS — M545 Low back pain: Secondary | ICD-10-CM | POA: Diagnosis not present

## 2019-07-12 DIAGNOSIS — M5416 Radiculopathy, lumbar region: Secondary | ICD-10-CM | POA: Diagnosis not present

## 2019-10-01 ENCOUNTER — Other Ambulatory Visit: Payer: Self-pay | Admitting: Cardiology

## 2019-11-16 IMAGING — CT CT ABD-PELV W/ CM
2 of 5 series · 16 of 46 positions shown, 18 images · IV contrast (APPLIED)
Comparison: None.

CLINICAL DATA: 71-year-old female with abdominal pain.

EXAM:
CT ABDOMEN AND PELVIS WITH CONTRAST
TECHNIQUE: Multidetector CT imaging of the abdomen and pelvis was performed
using the standard protocol following bolus administration of
intravenous contrast.
CONTRAST:  100mL WRRC6G-LBB IOPAMIDOL (WRRC6G-LBB) INJECTION 61%

[Series 2: axial st · axial · 0.76mm/px · z∈[-481,-81]mm · 13 of 92 slices shown, 15 images]
[im 6/92  soft-tissue]
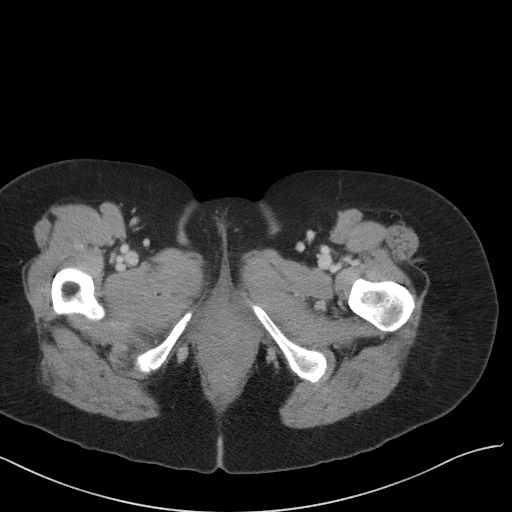
[im 6/92  bone]
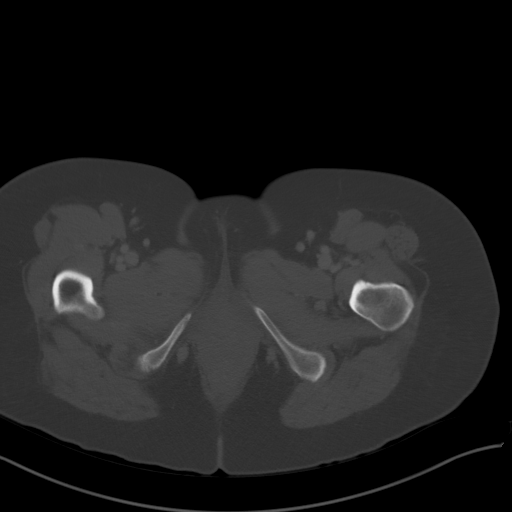
[im 11/92  soft-tissue]
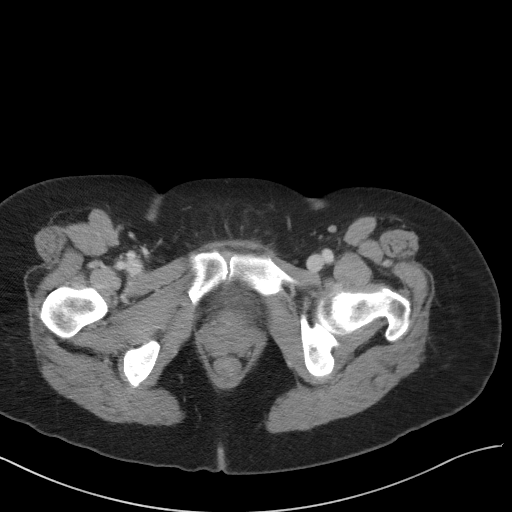
[im 21/92  soft-tissue]
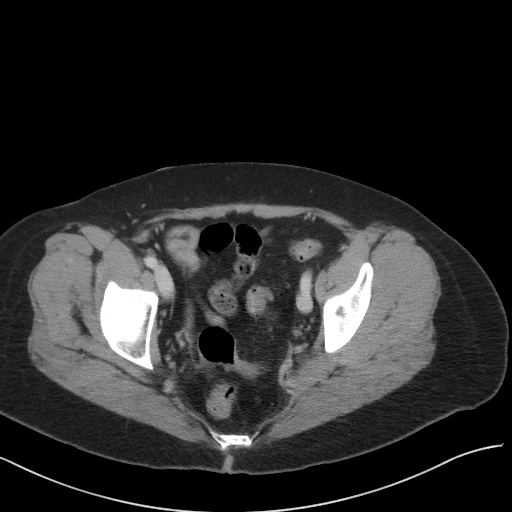
[im 26/92  soft-tissue]
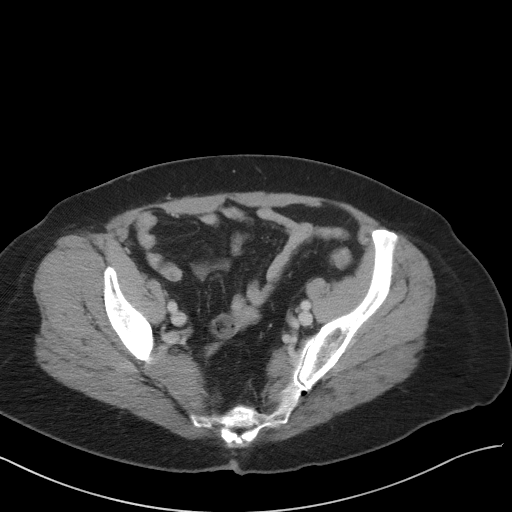
[im 31/92  soft-tissue]
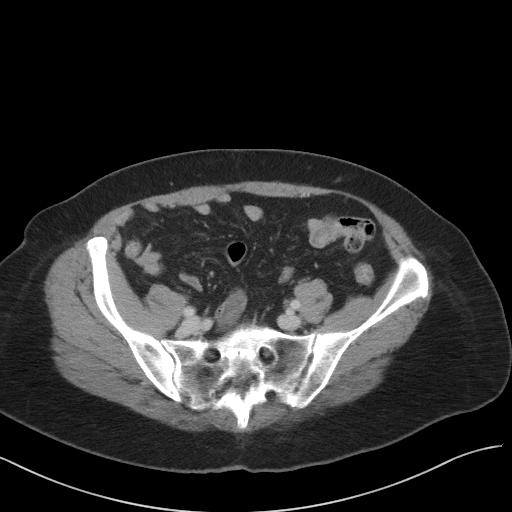
[im 41/92  soft-tissue]
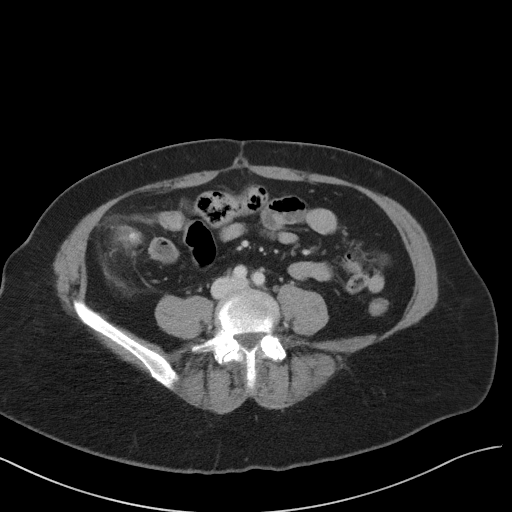
[im 46/92  soft-tissue]
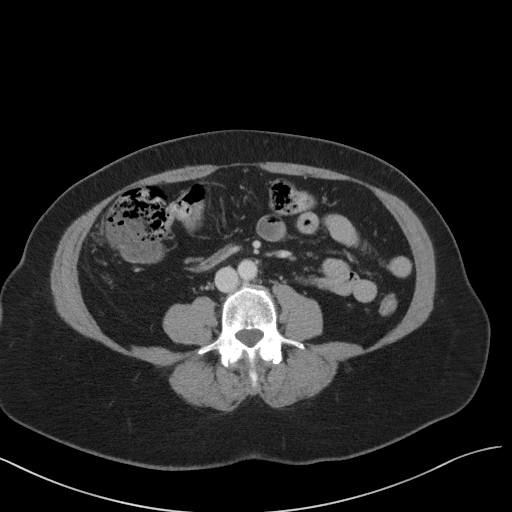
[im 51/92  soft-tissue]
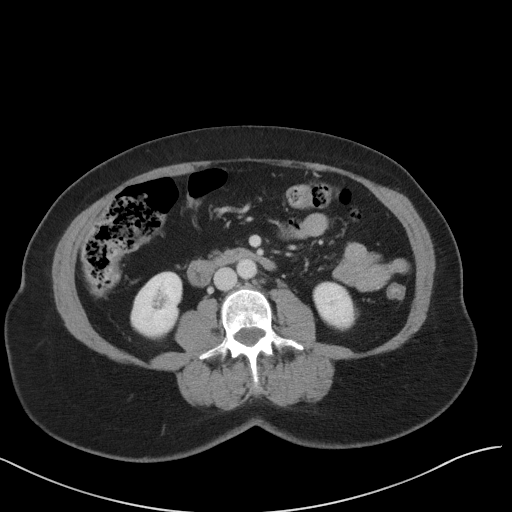
[im 61/92  soft-tissue]
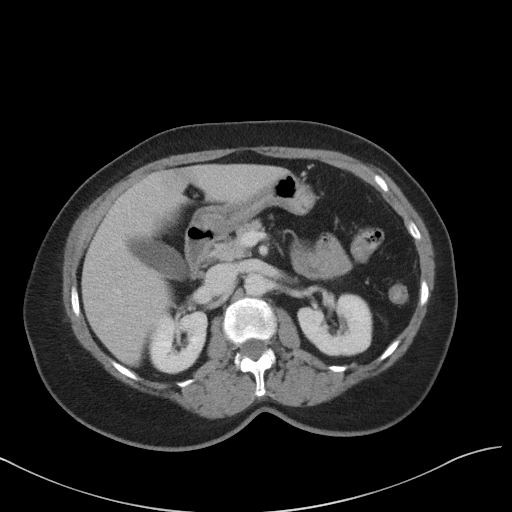
[im 61/92  bone]
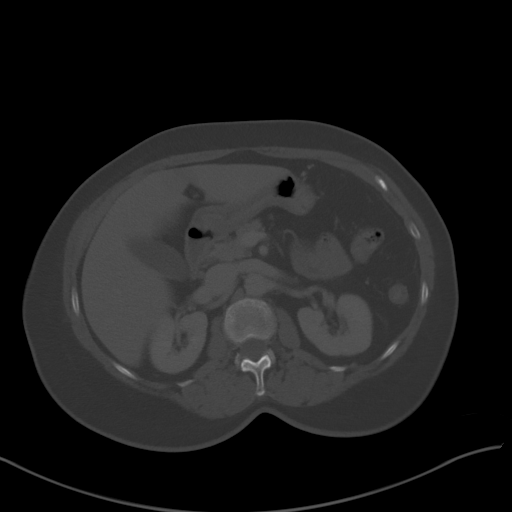
[im 66/92  soft-tissue]
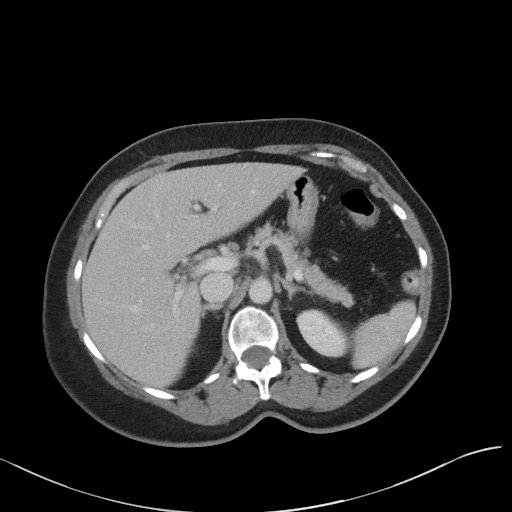
[im 71/92  soft-tissue]
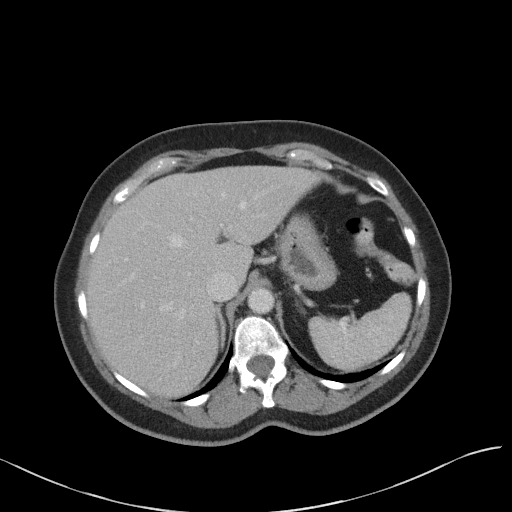
[im 81/92  soft-tissue]
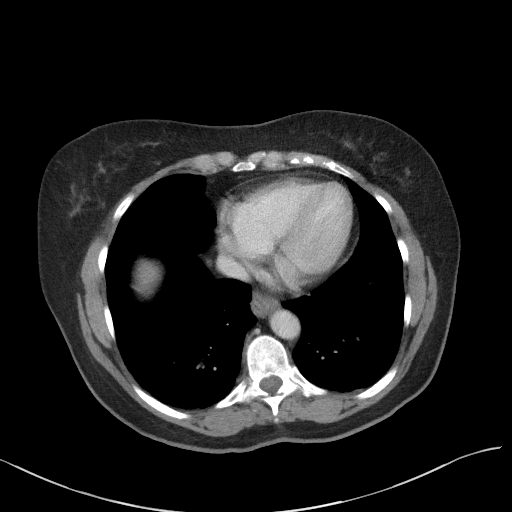
[im 86/92  soft-tissue]
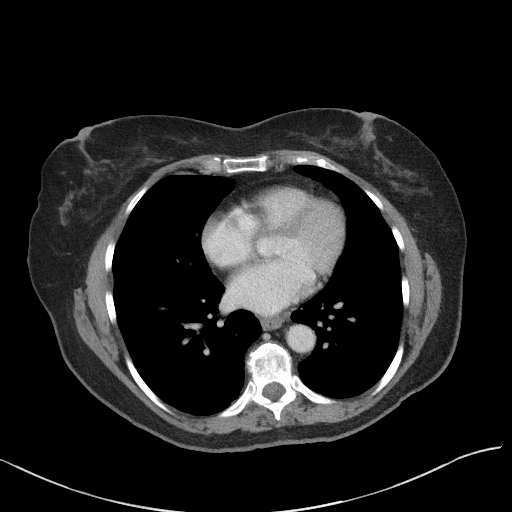

[Series 5: coronal st · coronal · 0.71mm/px · 3 of 78 slices shown]
[im 26/78  soft-tissue]
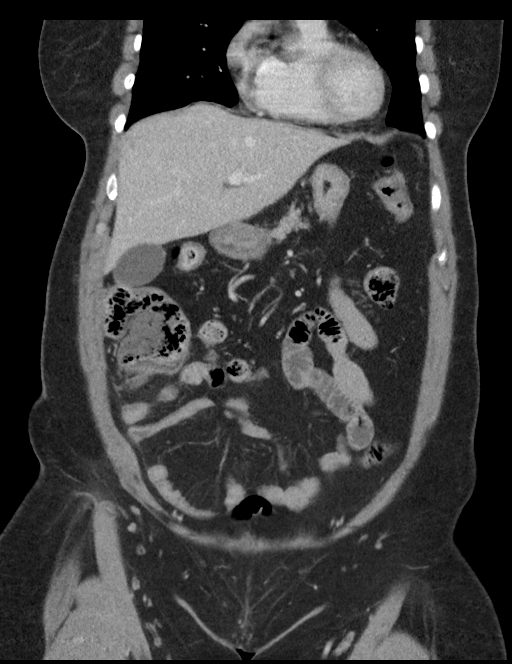
[im 35/78  soft-tissue]
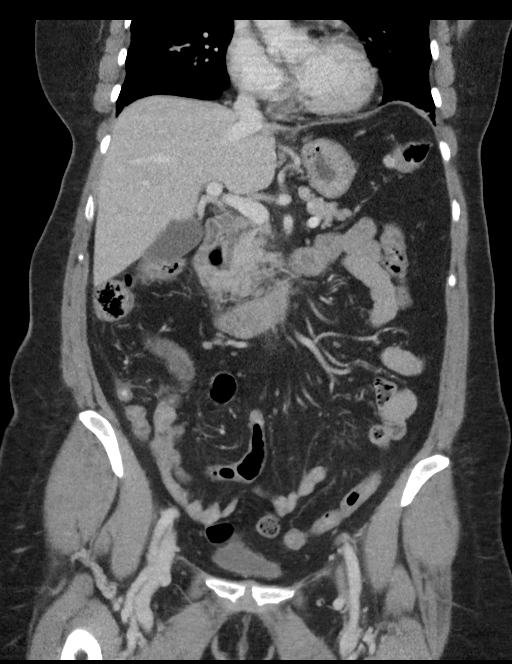
[im 43/78  soft-tissue]
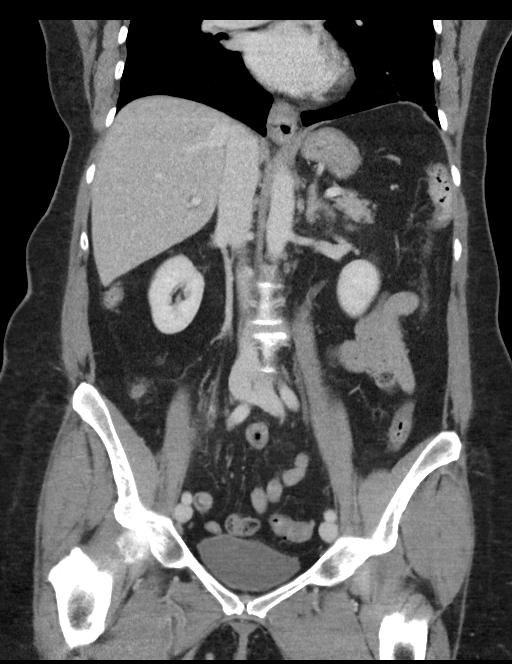

[16 of 46 positions shown; findings below may reference images not displayed]

FINDINGS: Lower chest: The visualized lung bases are clear.

No intra-abdominal free air or free fluid.

Hepatobiliary: No focal liver abnormality is seen. No gallstones,
gallbladder wall thickening, or biliary dilatation.

Pancreas: Unremarkable. No pancreatic ductal dilatation or
surrounding inflammatory changes.

Spleen: Normal in size without focal abnormality.

Adrenals/Urinary Tract: The adrenal glands are unremarkable. There
is a 3 mm right renal interpolar nonobstructing stone. There is no
hydronephrosis on either side. There is symmetric enhancement and
excretion of contrast by both kidneys. The visualized ureters and
urinary bladder appear unremarkable.

Stomach/Bowel: Small scattered sigmoid diverticula. No active
inflammatory changes. There is a small hiatal hernia. There is no
bowel obstruction the appendix is enlarged and inflamed. The
appendix is located in the right lower quadrant inferior to the
cecum. There is mild enhancement of the appendiceal mucosa. There is
a 9 mm stone at the base of the appendix. A smaller stone is also
noted in the mid lumen of the appendix. The appendix measures
approximately 12 mm in diameter. No evidence of perforation or
abscess.

Vascular/Lymphatic: No significant vascular findings are present. No
enlarged abdominal or pelvic lymph nodes.

Reproductive: Hysterectomy.  No pelvic mass.

Other: None

Musculoskeletal: Mild degenerative changes of the spine. No acute
osseous pathology.
IMPRESSION: 1. Acute appendicitis.  No abscess or perforation.
2. Mild sigmoid diverticulosis.
3. A 3 mm nonobstructing right renal stone.  No hydronephrosis.

## 2019-12-07 DIAGNOSIS — Z23 Encounter for immunization: Secondary | ICD-10-CM | POA: Diagnosis not present

## 2019-12-17 DIAGNOSIS — Z23 Encounter for immunization: Secondary | ICD-10-CM | POA: Diagnosis not present

## 2020-01-07 DIAGNOSIS — H5203 Hypermetropia, bilateral: Secondary | ICD-10-CM | POA: Diagnosis not present

## 2020-01-07 DIAGNOSIS — H524 Presbyopia: Secondary | ICD-10-CM | POA: Diagnosis not present

## 2020-01-07 DIAGNOSIS — H2513 Age-related nuclear cataract, bilateral: Secondary | ICD-10-CM | POA: Diagnosis not present

## 2020-02-08 ENCOUNTER — Encounter: Payer: Self-pay | Admitting: Cardiology

## 2020-02-08 ENCOUNTER — Telehealth (INDEPENDENT_AMBULATORY_CARE_PROVIDER_SITE_OTHER): Payer: Medicare Other | Admitting: Cardiology

## 2020-02-08 VITALS — BP 158/92 | HR 79 | Ht 62.5 in | Wt 155.0 lb

## 2020-02-08 DIAGNOSIS — I10 Essential (primary) hypertension: Secondary | ICD-10-CM

## 2020-02-08 NOTE — Patient Instructions (Signed)
Medication Instructions:  Continue current medications  *If you need a refill on your cardiac medications before your next appointment, please call your pharmacy*   Lab Work: None ordered   Testing/Procedures: None Ordered   Follow-Up: At [facility], you and your health needs are our priority.  As part of our continuing mission to provide you with exceptional heart care, we have created designated Provider Care Teams.  These Care Teams include your primary Cardiologist (physician) and Advanced Practice Providers (APPs -  Physician Assistants and Nurse Practitioners) who all work together to provide you with the care you need, when you need it.  We recommend signing up for the patient portal called "MyChart".  Sign up information is provided on this After Visit Summary.  MyChart is used to connect with patients for Virtual Visits (Telemedicine).  Patients are able to view lab/test results, encounter notes, upcoming appointments, etc.  Non-urgent messages can be sent to your provider as well.   To learn more about what you can do with MyChart, go to [email or web address].    Your next appointment:   As Needed

## 2020-02-08 NOTE — Progress Notes (Signed)
Virtual Visit via Telephone Note   This visit type was conducted due to national recommendations for restrictions regarding the COVID-19 Pandemic (e.g. social distancing) in an effort to limit this patient's exposure and mitigate transmission in our community.  Due to her co-morbid illnesses, this patient is at least at moderate risk for complications without adequate follow up.  This format is felt to be most appropriate for this patient at this time.  The patient did not have access to video technology/had technical difficulties with video requiring transitioning to audio format only (telephone).  All issues noted in this document were discussed and addressed.  No physical exam could be performed with this format.  Please refer to the patient's chart for her  consent to telehealth for [facility].    Date:  02/08/2020   ID:  Sharon Cook, DOB October 10, 1945, MRN [id number] The patient was identified using 2 identifiers.  Patient Location: Home Provider Location: Home Office  PCP:  Orpah Melter, MD  Cardiologist:  Dr Stanford Breed Electrophysiologist:  None   Evaluation Performed:  Follow-Up Visit  Chief Complaint:  none  History of Present Illness:    Sharon Cook is a 75 y.o. female with with a past hx of chest pain and HTN.  Cardiac CTA in September 2019 showed a calcium score of 0 and no coronary disease.  She was contacted today for routine follow-up.  [place]  [place] living in the [place].  She moved in August and they are in the process of getting a primary care provider there.  They will return here next month to see their primary care provider.  The patient denies any problems with chest pain or shortness of breath.  I reviewed her blood pressures with her, I mentioned that they appeared to be trending higher than we would feel comfortable with.  When she last saw her primary care provider  she was told that her blood pressure was within normal limits and no changes were made.  I suggested the patient monitor her blood pressure over the next couple weeks and bring the log book with her when she comes to see her PCP.  Our goal blood pressure for her would be 135/85 or less.  Amlodipine may be a good addition if needed.  The patient does not have symptoms concerning for COVID-19 infection (fever, chills, cough, or new shortness of breath).    Past Medical History:  Diagnosis Date  . Acute appendicitis 02/14/2017  . 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 Eosinophilic esophagitis   . Estrogen deficiency   . GERD without esophagitis   . Hypertension, essential 2013  . Post-operative nausea and vomiting    per patient   . Pure hypercholesterolemia    problems tolerating statins>>rash  . Rhinitis due to pollen   . Superficial basal cell carcinoma (BCC) 04/15/1997   Right Arm  . Vitamin D deficiency    Past Surgical History:  Procedure Laterality Date  . ABDOMINAL HYSTERECTOMY    . CESAREAN SECTION     twice  . DILATION AND CURETTAGE OF UTERUS  08/1998  . ESOPHAGUS STRETCHED  03/2010  . LAPAROSCOPIC APPENDECTOMY N/A 02/14/2017   Procedure: APPENDECTOMY LAPAROSCOPIC;  Surgeon: Greer Pickerel, MD;  Location: [facility] OR;  Service: General;  Laterality: N/A;     Current Meds  Medication Sig  . aspirin EC 81 MG  tablet Take 81 mg by mouth daily.  . Calcium Carb-Cholecalciferol (CALCIUM-VITAMIN D) 500-200 MG-UNIT tablet Take 2 tablets by mouth daily.  . Coenzyme Q10 (COQ10) 200 MG CAPS Take 1 tablet by mouth daily.  Marland Kitchen ezetimibe (ZETIA) 10 MG tablet Take 10 mg by mouth daily.  . hydrochlorothiazide (HYDRODIURIL) 25 MG tablet Take 25 mg by mouth daily.  . magnesium oxide (MAG-OX) 400 (241.3 Mg) MG tablet Take 400 mg by mouth daily.  . Omega-3 Fatty Acids (FISH OIL) 1000 MG CAPS Take 2 capsules by mouth daily.   Marland Kitchen triamcinolone cream (KENALOG) 0.1 % Apply 1 application  topically 2 (two) times daily.  . [DISCONTINUED] hydrochlorothiazide (HYDRODIURIL) 12.5 MG tablet TAKE 2 TABLETS(25 MG) BY MOUTH DAILY (Patient taking differently: Take 12.5 mg by mouth daily.)     Allergies:   Codeine, Simvastatin, and Atorvastatin   Social History   Tobacco Use  . Smoking status: Never Smoker  . Smokeless tobacco: Never Used  Vaping Use  . Vaping Use: Never used  Substance Use Topics  . Alcohol use: Not Currently  . Drug use: Never     Family Hx: The patient's family history includes COPD in her sister; CVA (age of onset: 1) in her mother; Heart attack (age of onset: 62) in her father; Heart attack (age of onset: 41) in her sister; Melanoma in her niece; Rheum arthritis in her sister; Sudden Cardiac Death (age of onset: 53) in her father.  ROS:   Please see the history of present illness.    All other systems reviewed and are negative.   Prior CV studies:   The following studies were reviewed today:  Coronary CT 10/03/2017- IMPRESSION: 1. Calcium score 0  2.  Normal right dominant coronary arteries  3.  Normal aortic root 3.1 cm  Labs/Other Tests and Data Reviewed:    EKG:  An ECG dated 12/29/2018 was personally reviewed today and demonstrated:  NSR, 1st AVB  Recent Labs: No results found for requested labs within last 8760 hours.   Recent Lipid Panel No results found for: CHOL, TRIG, HDL, CHOLHDL, LDLCALC, LDLDIRECT  Wt Readings from Last 3 Encounters:  02/08/20 155 lb (70.3 kg)  12/29/18 160 lb (72.6 kg)  10/03/18 155 lb (70.3 kg)     Risk Assessment/Calculations:      Objective:    Vital Signs:  BP (!) 158/92   Pulse 79   Ht 5' 2.5" (1.588 m)   Wt 155 lb (70.3 kg)   BMI 27.90 kg/m    VITAL SIGNS:  reviewed  ASSESSMENT & PLAN:    HTN- F/U with PCP next month  Chest pain- No CAD and Ca++ score of 0 sept 2019.        COVID-19 Education: The signs and symptoms of COVID-19 were discussed with the patient and how to  seek care for testing (follow up with PCP or arrange E-visit).  The importance of social distancing was discussed today.  Time:   Today, I have spent 10 minutes with the patient with telehealth technology discussing the above problems.     Medication Adjustments/Labs and Tests Ordered: Current medicines are reviewed at length with the patient today.  Concerns regarding medicines are outlined above.   Tests Ordered: No orders of the defined types were placed in this encounter.   Medication Changes: No orders of the defined types were placed in this encounter.   Follow Up:  as needed   Signed, Kerin Ransom, PA-C  02/08/2020 2:31 PM  [facility]
